# Patient Record
Sex: Male | Born: 1940 | Race: White | Hispanic: No | Marital: Married | Smoking: Never smoker
Health system: Southern US, Community
[De-identification: ages and names within clinical notes are randomized; demographics above are authoritative.]

## PROBLEM LIST (undated history)

## (undated) DIAGNOSIS — T7840XA Allergy, unspecified, initial encounter: Secondary | ICD-10-CM

## (undated) DIAGNOSIS — E119 Type 2 diabetes mellitus without complications: Secondary | ICD-10-CM

## (undated) DIAGNOSIS — E785 Hyperlipidemia, unspecified: Secondary | ICD-10-CM

## (undated) DIAGNOSIS — N529 Male erectile dysfunction, unspecified: Secondary | ICD-10-CM

## (undated) DIAGNOSIS — I2510 Atherosclerotic heart disease of native coronary artery without angina pectoris: Secondary | ICD-10-CM

## (undated) DIAGNOSIS — I319 Disease of pericardium, unspecified: Secondary | ICD-10-CM

## (undated) DIAGNOSIS — M1990 Unspecified osteoarthritis, unspecified site: Secondary | ICD-10-CM

## (undated) DIAGNOSIS — B019 Varicella without complication: Secondary | ICD-10-CM

## (undated) DIAGNOSIS — I209 Angina pectoris, unspecified: Secondary | ICD-10-CM

## (undated) DIAGNOSIS — I519 Heart disease, unspecified: Secondary | ICD-10-CM

## (undated) HISTORY — PX: AMPUTATION FINGER / THUMB: SUR24

## (undated) HISTORY — DX: Varicella without complication: B01.9

## (undated) HISTORY — DX: Hyperlipidemia, unspecified: E78.5

## (undated) HISTORY — PX: EYE SURGERY: SHX253

## (undated) HISTORY — DX: Disease of pericardium, unspecified: I31.9

## (undated) HISTORY — PX: NASAL SEPTUM SURGERY: SHX37

## (undated) HISTORY — DX: Allergy, unspecified, initial encounter: T78.40XA

## (undated) HISTORY — DX: Heart disease, unspecified: I51.9

## (undated) HISTORY — DX: Male erectile dysfunction, unspecified: N52.9

---

## 1976-03-26 DIAGNOSIS — I319 Disease of pericardium, unspecified: Secondary | ICD-10-CM

## 1976-03-26 HISTORY — DX: Disease of pericardium, unspecified: I31.9

## 2010-05-16 ENCOUNTER — Encounter: Payer: Self-pay | Admitting: Cardiology

## 2010-05-19 ENCOUNTER — Encounter: Payer: Self-pay | Admitting: Cardiology

## 2010-05-26 ENCOUNTER — Encounter: Payer: Self-pay | Admitting: Cardiology

## 2010-06-02 DIAGNOSIS — R079 Chest pain, unspecified: Secondary | ICD-10-CM | POA: Insufficient documentation

## 2010-06-02 DIAGNOSIS — E785 Hyperlipidemia, unspecified: Secondary | ICD-10-CM | POA: Insufficient documentation

## 2010-06-05 ENCOUNTER — Other Ambulatory Visit: Payer: Self-pay | Admitting: Cardiology

## 2010-06-05 ENCOUNTER — Encounter: Payer: Self-pay | Admitting: Cardiology

## 2010-06-05 ENCOUNTER — Ambulatory Visit (INDEPENDENT_AMBULATORY_CARE_PROVIDER_SITE_OTHER): Payer: Medicare Other | Admitting: Cardiology

## 2010-06-05 DIAGNOSIS — R0789 Other chest pain: Secondary | ICD-10-CM

## 2010-06-05 DIAGNOSIS — I200 Unstable angina: Secondary | ICD-10-CM

## 2010-06-05 DIAGNOSIS — Z01810 Encounter for preprocedural cardiovascular examination: Secondary | ICD-10-CM

## 2010-06-05 LAB — CBC WITH DIFFERENTIAL/PLATELET
Basophils Relative: 0.5 % (ref 0.0–3.0)
Eosinophils Absolute: 0.2 10*3/uL (ref 0.0–0.7)
Hemoglobin: 16.8 g/dL (ref 13.0–17.0)
Lymphocytes Relative: 13.8 % (ref 12.0–46.0)
MCHC: 34.6 g/dL (ref 30.0–36.0)
MCV: 96.7 fl (ref 78.0–100.0)
Neutro Abs: 9.6 10*3/uL — ABNORMAL HIGH (ref 1.4–7.7)
RBC: 5.03 Mil/uL (ref 4.22–5.81)

## 2010-06-05 LAB — BASIC METABOLIC PANEL
BUN: 18 mg/dL (ref 6–23)
Chloride: 100 mEq/L (ref 96–112)
Creatinine, Ser: 1 mg/dL (ref 0.4–1.5)

## 2010-06-05 LAB — APTT: aPTT: 32.6 s — ABNORMAL HIGH (ref 21.7–28.8)

## 2010-06-06 ENCOUNTER — Telehealth: Payer: Self-pay | Admitting: Cardiology

## 2010-06-09 ENCOUNTER — Ambulatory Visit: Admission: RE | Admit: 2010-06-09 | Payer: Medicare Other | Source: Ambulatory Visit | Admitting: Cardiology

## 2010-06-09 ENCOUNTER — Inpatient Hospital Stay (HOSPITAL_BASED_OUTPATIENT_CLINIC_OR_DEPARTMENT_OTHER)
Admission: RE | Admit: 2010-06-09 | Discharge: 2010-06-09 | Disposition: A | Discharge status: Home / Self Care | Payer: Medicare Other | Source: Ambulatory Visit | Attending: Cardiology | Admitting: Cardiology

## 2010-06-09 DIAGNOSIS — R0989 Other specified symptoms and signs involving the circulatory and respiratory systems: Secondary | ICD-10-CM | POA: Insufficient documentation

## 2010-06-09 DIAGNOSIS — R0609 Other forms of dyspnea: Secondary | ICD-10-CM | POA: Insufficient documentation

## 2010-06-09 DIAGNOSIS — I2510 Atherosclerotic heart disease of native coronary artery without angina pectoris: Secondary | ICD-10-CM | POA: Insufficient documentation

## 2010-06-09 DIAGNOSIS — I200 Unstable angina: Secondary | ICD-10-CM | POA: Insufficient documentation

## 2010-06-13 ENCOUNTER — Telehealth: Payer: Self-pay | Admitting: Cardiology

## 2010-06-13 NOTE — Letter (Signed)
Summary: Cardiac Catheterization Instructions- [facility]  [employer], Main Office  [address]   [place], [place] [address]   Phone: [phone]  Fax: [phone]     06/05/2010 MRN: [id number]  Omar Bridges [address] [place], [place]  [address]  [place]  Dear Mr. KUMPF,   You are scheduled for a Cardiac Catheterization on Friday June 09, 2010 with Dr.Bryten Maher  Please arrive to the 1st floor of the Heart and Vascular Center at [facility] at 6:30 am on the day of your procedure. Please do not arrive before 6:30 a.m. Call the Heart and Vascular Center at [phone] if you are unable to make your appointmnet. The Code to get into the parking garage under the building is 3000. Take the elevators to the 1st floor. You must have someone to drive you home. Someone must be with you for the first 24 hours after you arrive home. Please wear clothes that are easy to get on and off and wear slip-on shoes. Do not eat or drink after midnight except water with your medications that morning. Bring all your medications and current insurance cards with you.    ___ Make sure you take your aspirin.  ___ You may take ALL of your medications with water that morning.      The usual length of stay after your procedure is 2 to 3 hours. This can vary.  If you have any questions, please call the office at the number listed above.   Rocco Serene, RN

## 2010-06-13 NOTE — Assessment & Plan Note (Signed)
Summary: chest pain/dr jacobucci [phone]/medicare [phone]   Visit Type:  Initial Consult Primary Provider:  Dr. Pablo Lawrence  CC:  Angina.  History of Present Illness: The patient presents for evaluation of jaw discomfort and dyspnea. The patient has no past cardiac history other than pericarditis at age 70. He reports a normal catheterization at that time. These have some discomfort in his throat with activities for about 5 years. However, this was only in cold weather. It was mild. He did have a stress test in November of last year and apparently was unremarkable. He has also seen a GI doctor and been told he had cholelithiasis though this was not felt to be the culprit. Over the past 3 weeks he has noticed increasing discomfort is similar to previous throat tightness. However, he now has discomfort going into his jaw. He has been short of breath with this and having increased fatigue. These are all new symptoms. They are reproducible with minimal exertion such as walking up stairs and it is increasing in frequency and severity. He has not yet had resting discomfort. He has actually stopped his physical activity recently. He denies palpitations, presyncope or syncope. He has had no PND or orthopnea.   Current Medications (verified): 1)  Nitrostat 0.4 Mg Subl (Nitroglycerin) .... As Needed As Directed For Chest Pian 2)  Tylenol Severe Allergy 12.5-500 Mg Tabs (Diphenhydramine-Acetaminophen) .... As Needed 3)  Excedrin Pm 500-38 Mg Tabs (Diphenhydramine-Apap (Sleep)) .... 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  Social History: Full Time Married  Never smoked, chewed tobacco Alcohol Use -  no Regular Exercise - no  Review of Systems       Positive for reflux. Otherwise as stated in the history of present illness negative for all other systems.  Vital Signs:  Patient profile:   70 year old male Height:      72 inches Weight:      199 pounds BMI:     27.09 Pulse rate:   80 / minute Resp:     18 per minute BP sitting:   133 / 72  (right arm)  Vitals Entered By: Marrion Coy, CNA (June 05, 2010 12:39 PM)  Physical Exam  General:  Well developed, well nourished, in no acute distress. Head:  normocephalic and atraumatic Eyes:  PERRLA/EOM intact; conjunctiva and lids normal. Mouth:  Teeth, gums and palate normal. Oral mucosa normal. Neck:  Neck supple, no JVD. No masses, thyromegaly or abnormal cervical nodes. Chest Wall:  no deformities or breast masses noted Lungs:  Clear bilaterally to auscultation and percussion. Abdomen:  Bowel sounds positive; abdomen soft and non-tender without masses, organomegaly, or hernias noted. No hepatosplenomegaly. Msk:  Back normal, normal gait. Muscle strength and tone normal. Extremities:  No clubbing or cyanosis. Neurologic:  Alert and oriented x 3. Skin:  Intact without lesions or rashes. Cervical Nodes:  no significant adenopathy Axillary Nodes:  no significant adenopathy Inguinal Nodes:  no significant adenopathy Psych:  Normal affect.   Detailed Cardiovascular Exam  Neck    Carotids: Carotids full and equal bilaterally without bruits.      Neck Veins: Normal, no JVD.    Heart    Inspection: no deformities or lifts noted.  Palpation: normal PMI with no thrills palpable.      Auscultation: regular rate and rhythm, S1, S2 without murmurs, rubs, gallops, or clicks.    Vascular    Abdominal Aorta: no palpable masses, pulsations, or audible bruits.      Femoral Pulses: normal femoral pulses bilaterally.      Pedal Pulses: normal pedal pulses bilaterally.      Radial Pulses: normal radial pulses bilaterally.       Peripheral Circulation: no clubbing, cyanosis, or edema noted with normal capillary refill.     EKG  Procedure date:  05/16/2010  Findings:      Sinus rhythm, rate 80, leftward axis, no acute ST-T wave changes  Impression & Recommendations:  Problem # 1:  CHEST PAIN-UNSPECIFIED (ICD-786.50) The patient's chest pain is consistent with increasing exertional angina (unstable). The pretest daily of obstructive disease is high and the patient will proceed with cardiac cath. His updated medication list for this problem includes:    Nitrostat 0.4 Mg Subl (Nitroglycerin) .Marland Kitchen... As needed as directed for chest pian  Orders: TLB-CBC Platelet - w/Differential (85025-CBCD) TLB-BMP (Basic Metabolic Panel-BMET) (80048-METABOL) TLB-PTT (85730-PTTL) TLB-PT (Protime) (85610-PTP)  Problem # 2:  HYPERLIPIDEMIA-MIXED (ICD-272.4) This has been managed by his primary MD.    We will determine goals based on the presence or absence of coronary disease.  Patient Instructions: 1)  Your physician recommends that you schedule a follow-up appointment after your cardiac cath. 2)  Your physician recommends that you continue on your current medications as directed. Please refer to the Current Medication list given to you today. 3)  Your physician has requested that you have a cardiac catheterization.  Cardiac catheterization is used to diagnose and/or treat various heart conditions. Doctors may recommend this procedure for a number of different reasons. The most common reason is to evaluate chest pain. Chest pain can be a symptom of coronary artery disease (CAD), and cardiac catheterization can show whether plaque is narrowing or blocking your heart's arteries. This procedure is also used to evaluate the valves, as well as measure the blood flow and oxygen levels in different parts of your heart.  For further information please visit [email or web address]  Please follow instruction sheet, as given.

## 2010-06-13 NOTE — Progress Notes (Signed)
Summary: RX Nexium 40 mg #30    Rx for Nexium 40 mg daily #30 no refills sent electronically Avie Arenas, RN  ---- Converted from flag ---- ---- 06/05/2010 2:54 PM, Rollene Rotunda, MD, [facility] wrote: Please send a prescription for Nexium 40 mg daily by mouth x 30 pills.  No refills. ------------------------------

## 2010-06-13 NOTE — Telephone Encounter (Signed)
Left message to call back  

## 2010-06-13 NOTE — Miscellaneous (Signed)
Summary: order for Nexium 40  Clinical Lists Changes  Medications: Added new medication of NEXIUM 40 MG CPDR (ESOMEPRAZOLE MAGNESIUM) one daily - Signed Rx of NEXIUM 40 MG CPDR (ESOMEPRAZOLE MAGNESIUM) one daily;  #30 x 0;  Signed;  Entered by: Charolotte Capuchin, RN;  Authorized by: Rollene Rotunda, MD, [facility];  Method used: Electronically to [facility] [phone]*, [address], [place], [place]  [address], Ph: [phone], Fax: [phone]    Prescriptions: NEXIUM 40 MG CPDR (ESOMEPRAZOLE MAGNESIUM) one daily  #30 x 0   Entered by:   Charolotte Capuchin, RN   Authorized by:   Rollene Rotunda, MD, [facility]   Signed by:   Charolotte Capuchin, RN on 06/06/2010   Method used:   Electronically to        [facility] [phone]* (retail)       [address]       [place], [place]  [address]       Ph: [phone]       Fax: [phone]   RxID:   [id number]

## 2010-06-14 NOTE — Telephone Encounter (Signed)
Per Dr Antoine Poche - OK for pt to hold Toprol -  Pt is aware and states understanding.  He does have an appt tomorrow with Dr Tyrone Sage.

## 2010-06-15 ENCOUNTER — Encounter (INDEPENDENT_AMBULATORY_CARE_PROVIDER_SITE_OTHER): Payer: Medicare Other | Admitting: Cardiothoracic Surgery

## 2010-06-15 DIAGNOSIS — I2510 Atherosclerotic heart disease of native coronary artery without angina pectoris: Secondary | ICD-10-CM

## 2010-06-16 ENCOUNTER — Other Ambulatory Visit: Payer: Self-pay | Admitting: Cardiothoracic Surgery

## 2010-06-16 ENCOUNTER — Ambulatory Visit (HOSPITAL_COMMUNITY)
Admission: RE | Admit: 2010-06-16 | Discharge: 2010-06-16 | Disposition: A | Discharge status: Home / Self Care | Payer: Medicare Other | Source: Ambulatory Visit | Attending: Cardiothoracic Surgery | Admitting: Cardiothoracic Surgery

## 2010-06-16 ENCOUNTER — Telehealth: Payer: Self-pay | Admitting: Cardiology

## 2010-06-16 ENCOUNTER — Inpatient Hospital Stay (HOSPITAL_COMMUNITY)
Admission: RE | Admit: 2010-06-16 | Discharge: 2010-06-16 | Disposition: A | Discharge status: Home / Self Care | Payer: Medicare Other | Source: Ambulatory Visit | Attending: Cardiothoracic Surgery | Admitting: Cardiothoracic Surgery

## 2010-06-16 ENCOUNTER — Other Ambulatory Visit (HOSPITAL_COMMUNITY): Payer: Medicare Other

## 2010-06-16 DIAGNOSIS — Z01810 Encounter for preprocedural cardiovascular examination: Secondary | ICD-10-CM | POA: Insufficient documentation

## 2010-06-16 DIAGNOSIS — Z01811 Encounter for preprocedural respiratory examination: Secondary | ICD-10-CM

## 2010-06-16 DIAGNOSIS — Z01818 Encounter for other preprocedural examination: Secondary | ICD-10-CM | POA: Insufficient documentation

## 2010-06-16 DIAGNOSIS — R059 Cough, unspecified: Secondary | ICD-10-CM | POA: Insufficient documentation

## 2010-06-16 DIAGNOSIS — J309 Allergic rhinitis, unspecified: Secondary | ICD-10-CM | POA: Insufficient documentation

## 2010-06-16 DIAGNOSIS — R05 Cough: Secondary | ICD-10-CM | POA: Insufficient documentation

## 2010-06-16 DIAGNOSIS — Z01812 Encounter for preprocedural laboratory examination: Secondary | ICD-10-CM | POA: Insufficient documentation

## 2010-06-16 DIAGNOSIS — I2510 Atherosclerotic heart disease of native coronary artery without angina pectoris: Secondary | ICD-10-CM | POA: Insufficient documentation

## 2010-06-16 LAB — URINALYSIS, ROUTINE W REFLEX MICROSCOPIC
Bilirubin Urine: NEGATIVE
Glucose, UA: NEGATIVE mg/dL
Hgb urine dipstick: NEGATIVE
Ketones, ur: NEGATIVE mg/dL
Nitrite: NEGATIVE
Protein, ur: NEGATIVE mg/dL
Specific Gravity, Urine: 1.018 (ref 1.005–1.030)
Urobilinogen, UA: 1 mg/dL (ref 0.0–1.0)
pH: 7.5 (ref 5.0–8.0)

## 2010-06-16 LAB — COMPREHENSIVE METABOLIC PANEL
ALT: 32 U/L (ref 0–53)
AST: 35 U/L (ref 0–37)
Albumin: 4.5 g/dL (ref 3.5–5.2)
Alkaline Phosphatase: 46 U/L (ref 39–117)
BUN: 17 mg/dL (ref 6–23)
CO2: 22 mEq/L (ref 19–32)
Calcium: 9.4 mg/dL (ref 8.4–10.5)
Chloride: 106 mEq/L (ref 96–112)
Creatinine, Ser: 0.88 mg/dL (ref 0.4–1.5)
GFR calc Af Amer: >60 mL/min (ref >60)
GFR calc non Af Amer: >60 mL/min (ref >60)
Glucose, Bld: 177 mg/dL — ABNORMAL HIGH (ref 70–99)
Potassium: 4 mEq/L (ref 3.5–5.1)
Sodium: 134 mEq/L — ABNORMAL LOW (ref 135–145)
Total Bilirubin: 0.7 mg/dL (ref 0.3–1.2)
Total Protein: 7 g/dL (ref 6.0–8.3)

## 2010-06-16 LAB — BLOOD GAS, ARTERIAL
Acid-base deficit: 1.1 mmol/L (ref 0.0–2.0)
Bicarbonate: 23 mEq/L (ref 20.0–24.0)
Drawn by: 206361
FIO2: 0.21 %
O2 Saturation: 96.4 %
Patient temperature: 98.6
TCO2: 24.2 mmol/L (ref 0–100)
pCO2 arterial: 37.8 mmHg (ref 35.0–45.0)
pH, Arterial: 7.402 (ref 7.350–7.450)
pO2, Arterial: 84.7 mmHg (ref 80.0–100.0)

## 2010-06-16 LAB — HEMOGLOBIN A1C
Hgb A1c MFr Bld: 6 % — ABNORMAL HIGH (ref <5.7)
Mean Plasma Glucose: 126 mg/dL — ABNORMAL HIGH (ref <117)

## 2010-06-16 LAB — TYPE AND SCREEN
ABO/RH(D): O POS
Antibody Screen: NEGATIVE

## 2010-06-16 LAB — CBC
HCT: 45.9 % (ref 39.0–52.0)
Hemoglobin: 16.5 g/dL (ref 13.0–17.0)
MCH: 32.9 pg (ref 26.0–34.0)
MCHC: 35.9 g/dL (ref 30.0–36.0)
MCV: 91.6 fL (ref 78.0–100.0)
Platelets: 198 10*3/uL (ref 150–400)
RBC: 5.01 MIL/uL (ref 4.22–5.81)
RDW: 12.5 % (ref 11.5–15.5)
WBC: 10.4 10*3/uL (ref 4.0–10.5)

## 2010-06-16 LAB — ABO/RH: ABO/RH(D): O POS

## 2010-06-16 LAB — SURGICAL PCR SCREEN
MRSA, PCR: NEGATIVE
Staphylococcus aureus: NEGATIVE

## 2010-06-16 LAB — PROTIME-INR
INR: 0.96 (ref 0.00–1.49)
Prothrombin Time: 13 seconds (ref 11.6–15.2)

## 2010-06-16 LAB — APTT: aPTT: 34 seconds (ref 24–37)

## 2010-06-16 NOTE — Telephone Encounter (Signed)
Faxed OV, Cath & Labs to Tanya (per Delice Bison) at [facility] ([phone]).

## 2010-06-20 ENCOUNTER — Inpatient Hospital Stay (HOSPITAL_COMMUNITY)
Admission: RE | Admit: 2010-06-20 | Discharge: 2010-06-24 | DRG: 236 | Disposition: A | Discharge status: Home / Self Care | Payer: Medicare Other | Source: Ambulatory Visit | Attending: Cardiothoracic Surgery | Admitting: Cardiothoracic Surgery

## 2010-06-20 ENCOUNTER — Inpatient Hospital Stay (HOSPITAL_COMMUNITY): Payer: Medicare Other

## 2010-06-20 DIAGNOSIS — I2510 Atherosclerotic heart disease of native coronary artery without angina pectoris: Principal | ICD-10-CM | POA: Diagnosis present

## 2010-06-20 DIAGNOSIS — I200 Unstable angina: Secondary | ICD-10-CM | POA: Diagnosis present

## 2010-06-20 DIAGNOSIS — D696 Thrombocytopenia, unspecified: Secondary | ICD-10-CM | POA: Diagnosis not present

## 2010-06-20 DIAGNOSIS — D72829 Elevated white blood cell count, unspecified: Secondary | ICD-10-CM | POA: Diagnosis not present

## 2010-06-20 DIAGNOSIS — D62 Acute posthemorrhagic anemia: Secondary | ICD-10-CM | POA: Diagnosis not present

## 2010-06-20 LAB — POCT I-STAT 4, (NA,K, GLUC, HGB,HCT)
Glucose, Bld: 122 mg/dL — ABNORMAL HIGH (ref 70–99)
Glucose, Bld: 124 mg/dL — ABNORMAL HIGH (ref 70–99)
Glucose, Bld: 122 mg/dL — ABNORMAL HIGH (ref 70–99)
Glucose, Bld: 125 mg/dL — ABNORMAL HIGH (ref 70–99)
Glucose, Bld: 109 mg/dL — ABNORMAL HIGH (ref 70–99)
HCT: 37 % — ABNORMAL LOW (ref 39.0–52.0)
HCT: 40 % (ref 39.0–52.0)
HCT: 32 % — ABNORMAL LOW (ref 39.0–52.0)
HCT: 32 % — ABNORMAL LOW (ref 39.0–52.0)
HCT: 27 % — ABNORMAL LOW (ref 39.0–52.0)
Hemoglobin: 12.6 g/dL — ABNORMAL LOW (ref 13.0–17.0)
Hemoglobin: 13.6 g/dL (ref 13.0–17.0)
Hemoglobin: 10.9 g/dL — ABNORMAL LOW (ref 13.0–17.0)
Hemoglobin: 10.9 g/dL — ABNORMAL LOW (ref 13.0–17.0)
Hemoglobin: 9.2 g/dL — ABNORMAL LOW (ref 13.0–17.0)
Potassium: 3.9 mEq/L (ref 3.5–5.1)
Potassium: 4.2 mEq/L (ref 3.5–5.1)
Potassium: 4.1 mEq/L (ref 3.5–5.1)
Potassium: 4.6 mEq/L (ref 3.5–5.1)
Potassium: 3.8 mEq/L (ref 3.5–5.1)
Sodium: 140 mEq/L (ref 135–145)
Sodium: 138 mEq/L (ref 135–145)
Sodium: 133 mEq/L — ABNORMAL LOW (ref 135–145)
Sodium: 136 mEq/L (ref 135–145)
Sodium: 139 mEq/L (ref 135–145)

## 2010-06-20 LAB — POCT I-STAT 3, ART BLOOD GAS (G3+)
Acid-base deficit: 1 mmol/L (ref 0.0–2.0)
Acid-base deficit: 3 mmol/L — ABNORMAL HIGH (ref 0.0–2.0)
Bicarbonate: 23.2 mEq/L (ref 20.0–24.0)
Bicarbonate: 22.8 mEq/L (ref 20.0–24.0)
O2 Saturation: 100 %
O2 Saturation: 99 %
Patient temperature: 31.5
Patient temperature: 37
TCO2: 24 mmol/L (ref 0–100)
TCO2: 24 mmol/L (ref 0–100)
pCO2 arterial: 29.3 mmHg — ABNORMAL LOW (ref 35.0–45.0)
pCO2 arterial: 43.9 mmHg (ref 35.0–45.0)
pH, Arterial: 7.483 — ABNORMAL HIGH (ref 7.350–7.450)
pH, Arterial: 7.323 — ABNORMAL LOW (ref 7.350–7.450)
pO2, Arterial: 274 mmHg — ABNORMAL HIGH (ref 80.0–100.0)
pO2, Arterial: 142 mmHg — ABNORMAL HIGH (ref 80.0–100.0)

## 2010-06-20 LAB — POCT I-STAT 3, VENOUS BLOOD GAS (G3P V)
Acid-base deficit: 2 mmol/L (ref 0.0–2.0)
Bicarbonate: 22.9 mEq/L (ref 20.0–24.0)
O2 Saturation: 82 %
Patient temperature: 31.5
TCO2: 24 mmol/L (ref 0–100)
pCO2, Ven: 31.5 mmHg — ABNORMAL LOW (ref 45.0–50.0)
pH, Ven: 7.444 — ABNORMAL HIGH (ref 7.250–7.300)
pO2, Ven: 32 mmHg (ref 30.0–45.0)

## 2010-06-20 LAB — PROTIME-INR
INR: 1.36 (ref 0.00–1.49)
Prothrombin Time: 17 seconds — ABNORMAL HIGH (ref 11.6–15.2)

## 2010-06-20 LAB — CBC
HCT: 36.5 % — ABNORMAL LOW (ref 39.0–52.0)
HCT: 35.9 % — ABNORMAL LOW (ref 39.0–52.0)
Hemoglobin: 13 g/dL (ref 13.0–17.0)
Hemoglobin: 12.7 g/dL — ABNORMAL LOW (ref 13.0–17.0)
MCH: 32.3 pg (ref 26.0–34.0)
MCH: 32.2 pg (ref 26.0–34.0)
MCHC: 35.6 g/dL (ref 30.0–36.0)
MCHC: 35.4 g/dL (ref 30.0–36.0)
MCV: 90.8 fL (ref 78.0–100.0)
MCV: 91.1 fL (ref 78.0–100.0)
Platelets: 126 10*3/uL — ABNORMAL LOW (ref 150–400)
Platelets: 146 10*3/uL — ABNORMAL LOW (ref 150–400)
RBC: 4.02 MIL/uL — ABNORMAL LOW (ref 4.22–5.81)
RBC: 3.94 MIL/uL — ABNORMAL LOW (ref 4.22–5.81)
RDW: 12.4 % (ref 11.5–15.5)
RDW: 12.4 % (ref 11.5–15.5)
WBC: 17.3 10*3/uL — ABNORMAL HIGH (ref 4.0–10.5)
WBC: 16.1 10*3/uL — ABNORMAL HIGH (ref 4.0–10.5)

## 2010-06-20 LAB — HEMOGLOBIN AND HEMATOCRIT, BLOOD
HCT: 31.8 % — ABNORMAL LOW (ref 39.0–52.0)
Hemoglobin: 11.3 g/dL — ABNORMAL LOW (ref 13.0–17.0)

## 2010-06-20 LAB — CREATININE, SERUM
Creatinine, Ser: 0.85 mg/dL (ref 0.4–1.5)
GFR calc Af Amer: >60 mL/min (ref >60)
GFR calc non Af Amer: >60 mL/min (ref >60)

## 2010-06-20 LAB — GLUCOSE, CAPILLARY
Glucose-Capillary: 87 mg/dL (ref 70–99)
Glucose-Capillary: 86 mg/dL (ref 70–99)
Glucose-Capillary: 113 mg/dL — ABNORMAL HIGH (ref 70–99)

## 2010-06-20 LAB — PLATELET COUNT: Platelets: 143 10*3/uL — ABNORMAL LOW (ref 150–400)

## 2010-06-20 LAB — POCT I-STAT GLUCOSE
Glucose, Bld: 105 mg/dL — ABNORMAL HIGH (ref 70–99)
Operator id: 286331

## 2010-06-20 LAB — MAGNESIUM: Magnesium: 3.2 mg/dL — ABNORMAL HIGH (ref 1.5–2.5)

## 2010-06-20 LAB — APTT: aPTT: 31 seconds (ref 24–37)

## 2010-06-21 ENCOUNTER — Inpatient Hospital Stay (HOSPITAL_COMMUNITY): Payer: Medicare Other

## 2010-06-21 LAB — POCT I-STAT 3, ART BLOOD GAS (G3+)
Acid-base deficit: 3 mmol/L — ABNORMAL HIGH (ref 0.0–2.0)
Acid-base deficit: 3 mmol/L — ABNORMAL HIGH (ref 0.0–2.0)
Bicarbonate: 23.2 mEq/L (ref 20.0–24.0)
Bicarbonate: 22.8 mEq/L (ref 20.0–24.0)
O2 Saturation: 96 %
O2 Saturation: 97 %
Patient temperature: 35.7
Patient temperature: 37.2
TCO2: 24 mmol/L (ref 0–100)
TCO2: 24 mmol/L (ref 0–100)
pCO2 arterial: 40.2 mmHg (ref 35.0–45.0)
pCO2 arterial: 43.9 mmHg (ref 35.0–45.0)
pH, Arterial: 7.363 (ref 7.350–7.450)
pH, Arterial: 7.324 — ABNORMAL LOW (ref 7.350–7.450)
pO2, Arterial: 83 mmHg (ref 80.0–100.0)
pO2, Arterial: 95 mmHg (ref 80.0–100.0)

## 2010-06-21 LAB — POCT I-STAT, CHEM 8
BUN: 16 mg/dL (ref 6–23)
BUN: 30 mg/dL — ABNORMAL HIGH (ref 6–23)
Calcium, Ion: 1.07 mmol/L — ABNORMAL LOW (ref 1.12–1.32)
Calcium, Ion: 1.09 mmol/L — ABNORMAL LOW (ref 1.12–1.32)
Chloride: 105 mEq/L (ref 96–112)
Chloride: 104 mEq/L (ref 96–112)
Creatinine, Ser: 0.8 mg/dL (ref 0.4–1.5)
Creatinine, Ser: 0.9 mg/dL (ref 0.4–1.5)
Glucose, Bld: 173 mg/dL — ABNORMAL HIGH (ref 70–99)
Glucose, Bld: 146 mg/dL — ABNORMAL HIGH (ref 70–99)
HCT: 36 % — ABNORMAL LOW (ref 39.0–52.0)
HCT: 37 % — ABNORMAL LOW (ref 39.0–52.0)
Hemoglobin: 12.2 g/dL — ABNORMAL LOW (ref 13.0–17.0)
Hemoglobin: 12.6 g/dL — ABNORMAL LOW (ref 13.0–17.0)
Potassium: 4.8 mEq/L (ref 3.5–5.1)
Potassium: 4.2 mEq/L (ref 3.5–5.1)
Sodium: 139 mEq/L (ref 135–145)
Sodium: 137 mEq/L (ref 135–145)
TCO2: 24 mmol/L (ref 0–100)
TCO2: 23 mmol/L (ref 0–100)

## 2010-06-21 LAB — CBC
HCT: 35.2 % — ABNORMAL LOW (ref 39.0–52.0)
HCT: 37.1 % — ABNORMAL LOW (ref 39.0–52.0)
Hemoglobin: 12.3 g/dL — ABNORMAL LOW (ref 13.0–17.0)
Hemoglobin: 12.6 g/dL — ABNORMAL LOW (ref 13.0–17.0)
MCH: 32.1 pg (ref 26.0–34.0)
MCH: 31.7 pg (ref 26.0–34.0)
MCHC: 34.9 g/dL (ref 30.0–36.0)
MCHC: 34 g/dL (ref 30.0–36.0)
MCV: 91.9 fL (ref 78.0–100.0)
MCV: 93.2 fL (ref 78.0–100.0)
Platelets: 124 10*3/uL — ABNORMAL LOW (ref 150–400)
Platelets: 141 10*3/uL — ABNORMAL LOW (ref 150–400)
RBC: 3.83 MIL/uL — ABNORMAL LOW (ref 4.22–5.81)
RBC: 3.98 MIL/uL — ABNORMAL LOW (ref 4.22–5.81)
RDW: 12.8 % (ref 11.5–15.5)
RDW: 13.1 % (ref 11.5–15.5)
WBC: 16.6 10*3/uL — ABNORMAL HIGH (ref 4.0–10.5)
WBC: 20.3 10*3/uL — ABNORMAL HIGH (ref 4.0–10.5)

## 2010-06-21 LAB — GLUCOSE, CAPILLARY
Glucose-Capillary: 151 mg/dL — ABNORMAL HIGH (ref 70–99)
Glucose-Capillary: 148 mg/dL — ABNORMAL HIGH (ref 70–99)
Glucose-Capillary: 152 mg/dL — ABNORMAL HIGH (ref 70–99)
Glucose-Capillary: 126 mg/dL — ABNORMAL HIGH (ref 70–99)

## 2010-06-21 LAB — BASIC METABOLIC PANEL
BUN: 19 mg/dL (ref 6–23)
BUN: 28 mg/dL — ABNORMAL HIGH (ref 6–23)
CO2: 25 mEq/L (ref 19–32)
CO2: 25 mEq/L (ref 19–32)
Calcium: 7.7 mg/dL — ABNORMAL LOW (ref 8.4–10.5)
Calcium: 8.4 mg/dL (ref 8.4–10.5)
Chloride: 109 mEq/L (ref 96–112)
Chloride: 104 mEq/L (ref 96–112)
Creatinine, Ser: 0.94 mg/dL (ref 0.4–1.5)
Creatinine, Ser: 0.95 mg/dL (ref 0.4–1.5)
GFR calc Af Amer: >60 mL/min (ref >60)
GFR calc Af Amer: >60 mL/min (ref >60)
GFR calc non Af Amer: >60 mL/min (ref >60)
GFR calc non Af Amer: >60 mL/min (ref >60)
Glucose, Bld: 183 mg/dL — ABNORMAL HIGH (ref 70–99)
Glucose, Bld: 140 mg/dL — ABNORMAL HIGH (ref 70–99)
Potassium: 4.2 mEq/L (ref 3.5–5.1)
Potassium: 4.2 mEq/L (ref 3.5–5.1)
Sodium: 137 mEq/L (ref 135–145)
Sodium: 137 mEq/L (ref 135–145)

## 2010-06-21 LAB — POCT I-STAT 4, (NA,K, GLUC, HGB,HCT)
Glucose, Bld: 104 mg/dL — ABNORMAL HIGH (ref 70–99)
HCT: 37 % — ABNORMAL LOW (ref 39.0–52.0)
Hemoglobin: 12.6 g/dL — ABNORMAL LOW (ref 13.0–17.0)
Potassium: 3.7 mEq/L (ref 3.5–5.1)
Sodium: 138 mEq/L (ref 135–145)

## 2010-06-21 LAB — MAGNESIUM
Magnesium: 2.9 mg/dL — ABNORMAL HIGH (ref 1.5–2.5)
Magnesium: 2.9 mg/dL — ABNORMAL HIGH (ref 1.5–2.5)

## 2010-06-22 ENCOUNTER — Inpatient Hospital Stay (HOSPITAL_COMMUNITY): Payer: Medicare Other

## 2010-06-22 LAB — CBC
HCT: 34 % — ABNORMAL LOW (ref 39.0–52.0)
Hemoglobin: 11.8 g/dL — ABNORMAL LOW (ref 13.0–17.0)
MCH: 32.5 pg (ref 26.0–34.0)
MCHC: 34.7 g/dL (ref 30.0–36.0)
MCV: 93.7 fL (ref 78.0–100.0)
Platelets: 122 10*3/uL — ABNORMAL LOW (ref 150–400)
RBC: 3.63 MIL/uL — ABNORMAL LOW (ref 4.22–5.81)
RDW: 13.2 % (ref 11.5–15.5)
WBC: 15.7 10*3/uL — ABNORMAL HIGH (ref 4.0–10.5)

## 2010-06-22 LAB — BASIC METABOLIC PANEL
BUN: 25 mg/dL — ABNORMAL HIGH (ref 6–23)
CO2: 28 mEq/L (ref 19–32)
Calcium: 8.2 mg/dL — ABNORMAL LOW (ref 8.4–10.5)
Chloride: 102 mEq/L (ref 96–112)
Creatinine, Ser: 0.81 mg/dL (ref 0.4–1.5)
GFR calc Af Amer: >60 mL/min (ref >60)
GFR calc non Af Amer: >60 mL/min (ref >60)
Glucose, Bld: 123 mg/dL — ABNORMAL HIGH (ref 70–99)
Potassium: 3.9 mEq/L (ref 3.5–5.1)
Sodium: 137 mEq/L (ref 135–145)

## 2010-06-22 LAB — GLUCOSE, CAPILLARY
Glucose-Capillary: 122 mg/dL — ABNORMAL HIGH (ref 70–99)
Glucose-Capillary: 126 mg/dL — ABNORMAL HIGH (ref 70–99)
Glucose-Capillary: 130 mg/dL — ABNORMAL HIGH (ref 70–99)
Glucose-Capillary: 137 mg/dL — ABNORMAL HIGH (ref 70–99)
Glucose-Capillary: 141 mg/dL — ABNORMAL HIGH (ref 70–99)
Glucose-Capillary: 98 mg/dL (ref 70–99)

## 2010-06-22 NOTE — Op Note (Signed)
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  SURGICAL PROCEDURE:  Coronary artery bypass grafting x6 with left internal mammary to left anterior descending coronary artery, reverse saphenous vein graft to the diagonal coronary artery, sequential reverse saphenous vein graft to the first and second obtuse marginal, sequential reverse saphenous vein graft to the mid and distal posterior descending coronary artery with right leg endovein harvesting.  SURGEON:  Sheliah Plane, MD  FIRST ASSISTANT:  Kerin Perna, MD  SECOND ASSISTANT:  Rowe Clack, PA-C  BRIEF HISTORY:  The patient is a 70 year old male who has had at least 6 months of increasing symptoms of chest discomfort with exertion.  He ultimately underwent cardiac catheterization by Dr. Antoine Poche last week. This demonstrated severe three-vessel coronary artery disease with very significant distal disease but on review of the films, it was felt with the patient's symptoms and in spite of the degree of distal disease that he had sufficient proximal disease to warrant coronary artery bypass grafting.  The risks and options were discussed with the patient in detail and he was willing to proceed.  DESCRIPTION OF PROCEDURE:  With Swan-Ganz and arterial line monitors in place, the patient underwent general endotracheal anesthesia without incident.  Skin of chest and legs was prepped with Betadine and draped in usual sterile manner.  Using the [employer]ant  endovein harvesting system, vein was harvested from the right thigh and calf and was of good quality and caliber.  Median sternotomy was performed.  Left internal mammary artery was dissected down as a pedicle graft.  The distal artery was divided, had good free flow.  Pericardium was opened.  Overall, ventricular function appeared preserved.  He was systemically heparinized.  Ascending aorta was cannulated.  The right atrium was cannulated and aortic root vent cardioplegia needle was introduced into the ascending aorta.  The patient was placed on cardiopulmonary bypass 2.4 liters per minute per meter squared.  Sites of anastomosis were selected and dissected out of the epicardium.  The patient's body temperature was cooled to 30 degrees.  Aortic cross-clamp was applied and 500 mL of cold blood potassium cardioplegia was administered with diastolic arrest of the heart.  Myocardial septal temperature was monitored throughout the cross-clamp period.  Attention was turned first to the right coronary artery.  In the midportion of the vessel, there was obvious proximal, distal, and mid disease.  The midportion of the posterior descending was opened and in addition, the more distal portion was opened and still appeared to have a significant lumen.  A 1-mm probe would not pass between these 2 areas.  For this reason, we did a longitudinal side-to-side anastomosis with a running  7-0 Prolene with a second reverse saphenous vein graft.  The distal extent of the same vein was then carried short distance to the distal posterior descending as a jump graft.  The distal anastomosis was performed with a running 7-0 Prolene.  Additional cold blood cardioplegia was administered down the vein graft.  Attention was then turned to the first and second obtuse marginal.  Both of these were partially intramyocardial, dissected out, Each was approximately 1.4 to 1.5 mm in size.  The first obtuse marginal was  opened.  Using diamond type side-to-side anastomosis with a running 7-0 Prolene, distal anastomosis was performed.  The distal extent of the same vein was then carried to the second obtuse marginal and also sewed with a running 7-0 Prolene.  Attention was then turned to the diagonal artery which was relatively long artery but with distal disease.  The vessel was opened and admitted 1-mm probe.  Using a running 7-0 Prolene, distal anastomosis was performed.  Attention was then turned to the left anterior descending coronary artery which trifurcated at the distal portion in each of the branches and just before this trifurcated area, the LAD was opened, admitted a 1-mm probe distally and 1.5-mm probe proximally.  Using a running 8-0 Prolene, left internal mammary artery was anastomosed to left anterior descending coronary artery.  With cross- clamp still in place, 3 punch aortotomies were performed.  Each of the 3 vein grafts were anastomosed to the ascending aorta.  Air was evacuated from the grafts and the ascending aorta.  The bulldog was removed from the mammary artery with rise in myocardial septal temperature.  Aortic cross-clamp was removed.  Total cross-clamp time was 122 minutes.  The patient spontaneously converted to a sinus rhythm.  Atrial and ventricular pacing wires were applied.  Sites of anastomosis were inspected, free of bleeding.  The patient was then ventilated and weaned from cardiopulmonary bypass without difficulty, remained hemodynamically stable.  Total pump time was 157 minutes.  He was decannulated in the usual fashion.  Protamine sulfate was administered with operative field hemostatic.  A left pleural tube and a Blake mediastinal drain were left in place.  Pericardium was loosely reapproximated.  Sternum was closed with #6 stainless steel wire.  Fascia was closed with interrupted 0 Vicryl and running 3-0 Vicryl in subcutaneous tissue, and 4-0 subcuticular stitch  in skin edges.  Dry dressings were applied.  Sponge and needle count was reported as correct at completion of the procedure. The patient tolerated the procedure without obvious complication and was transferred to the Surgical Intensive Care Unit for further postoperative care.  He did not require any blood bank blood products during the operative procedure.     Sheliah Plane, MD     EG/MEDQ  D:  06/21/2010  T:  06/21/2010  Job:  [id number]  cc:   Rollene Rotunda, MD, [facility]  Electronically Signed by Sheliah Plane MD on 06/22/2010 03:39:50 PM

## 2010-06-23 ENCOUNTER — Inpatient Hospital Stay (HOSPITAL_COMMUNITY): Payer: Medicare Other

## 2010-06-23 LAB — CBC
HCT: 33.9 % — ABNORMAL LOW (ref 39.0–52.0)
Hemoglobin: 11.4 g/dL — ABNORMAL LOW (ref 13.0–17.0)
MCH: 31.6 pg (ref 26.0–34.0)
MCHC: 33.6 g/dL (ref 30.0–36.0)
MCV: 93.9 fL (ref 78.0–100.0)
Platelets: 141 10*3/uL — ABNORMAL LOW (ref 150–400)
RBC: 3.61 MIL/uL — ABNORMAL LOW (ref 4.22–5.81)
RDW: 13 % (ref 11.5–15.5)
WBC: 12 10*3/uL — ABNORMAL HIGH (ref 4.0–10.5)

## 2010-06-23 LAB — BASIC METABOLIC PANEL
BUN: 22 mg/dL (ref 6–23)
CO2: 29 mEq/L (ref 19–32)
Calcium: 8.2 mg/dL — ABNORMAL LOW (ref 8.4–10.5)
Chloride: 104 mEq/L (ref 96–112)
Creatinine, Ser: 0.82 mg/dL (ref 0.4–1.5)
GFR calc Af Amer: >60 mL/min (ref >60)
GFR calc non Af Amer: >60 mL/min (ref >60)
Glucose, Bld: 109 mg/dL — ABNORMAL HIGH (ref 70–99)
Potassium: 4.3 mEq/L (ref 3.5–5.1)
Sodium: 137 mEq/L (ref 135–145)

## 2010-06-23 LAB — GLUCOSE, CAPILLARY: Glucose-Capillary: 126 mg/dL — ABNORMAL HIGH (ref 70–99)

## 2010-06-25 HISTORY — PX: CORONARY ARTERY BYPASS GRAFT: SHX141

## 2010-06-27 ENCOUNTER — Emergency Department (HOSPITAL_COMMUNITY)
Admission: EM | Admit: 2010-06-27 | Discharge: 2010-06-28 | Disposition: A | Discharge status: Home / Self Care | Payer: Medicare Other | Attending: Emergency Medicine | Admitting: Emergency Medicine

## 2010-06-27 ENCOUNTER — Emergency Department (HOSPITAL_COMMUNITY): Payer: Medicare Other

## 2010-06-27 DIAGNOSIS — I319 Disease of pericardium, unspecified: Secondary | ICD-10-CM | POA: Insufficient documentation

## 2010-06-27 DIAGNOSIS — K219 Gastro-esophageal reflux disease without esophagitis: Secondary | ICD-10-CM | POA: Insufficient documentation

## 2010-06-27 DIAGNOSIS — J90 Pleural effusion, not elsewhere classified: Secondary | ICD-10-CM | POA: Insufficient documentation

## 2010-06-27 DIAGNOSIS — R0789 Other chest pain: Secondary | ICD-10-CM | POA: Insufficient documentation

## 2010-06-27 DIAGNOSIS — K7689 Other specified diseases of liver: Secondary | ICD-10-CM | POA: Insufficient documentation

## 2010-06-27 DIAGNOSIS — I498 Other specified cardiac arrhythmias: Secondary | ICD-10-CM | POA: Insufficient documentation

## 2010-06-27 DIAGNOSIS — E7800 Pure hypercholesterolemia, unspecified: Secondary | ICD-10-CM | POA: Insufficient documentation

## 2010-06-27 DIAGNOSIS — I2510 Atherosclerotic heart disease of native coronary artery without angina pectoris: Secondary | ICD-10-CM | POA: Insufficient documentation

## 2010-06-27 DIAGNOSIS — Z951 Presence of aortocoronary bypass graft: Secondary | ICD-10-CM | POA: Insufficient documentation

## 2010-06-27 LAB — BASIC METABOLIC PANEL
BUN: 20 mg/dL (ref 6–23)
Calcium: 9.5 mg/dL (ref 8.4–10.5)
Creatinine, Ser: 0.96 mg/dL (ref 0.4–1.5)
GFR calc non Af Amer: >60 mL/min (ref >60)
Glucose, Bld: 151 mg/dL — ABNORMAL HIGH (ref 70–99)
Potassium: 3.9 mEq/L (ref 3.5–5.1)

## 2010-06-27 LAB — CBC
MCV: 92.4 fL (ref 78.0–100.0)
Platelets: 334 10*3/uL (ref 150–400)
RBC: 4.1 MIL/uL — ABNORMAL LOW (ref 4.22–5.81)
RDW: 12.8 % (ref 11.5–15.5)
WBC: 14.1 10*3/uL — ABNORMAL HIGH (ref 4.0–10.5)

## 2010-06-27 LAB — DIFFERENTIAL
Basophils Absolute: 0.1 10*3/uL (ref 0.0–0.1)
Basophils Relative: 1 % (ref 0–1)
Eosinophils Absolute: 0.5 10*3/uL (ref 0.0–0.7)
Eosinophils Relative: 4 % (ref 0–5)
Lymphs Abs: 2.1 10*3/uL (ref 0.7–4.0)
Neutrophils Relative %: 75 % (ref 43–77)

## 2010-06-27 LAB — POCT CARDIAC MARKERS
CKMB, poc: 1.5 ng/mL (ref 1.0–8.0)
Myoglobin, poc: 110 ng/mL (ref 12–200)
Troponin i, poc: <0.05 ng/mL (ref 0.00–0.09)

## 2010-06-28 ENCOUNTER — Emergency Department (HOSPITAL_COMMUNITY): Payer: Medicare Other

## 2010-06-28 ENCOUNTER — Telehealth: Payer: Self-pay | Admitting: Cardiology

## 2010-06-28 MED ORDER — IOHEXOL 300 MG/ML  SOLN
100.0000 mL | Freq: Once | INTRAMUSCULAR | Status: AC | PRN
  Administered 2010-06-28: 100 mL via INTRAVENOUS

## 2010-06-28 NOTE — Telephone Encounter (Signed)
Pt was seen in the ed last night and was told to be seen within the next few days. Dr Antoine Poche or Lorin Picket doesn't have anything available.

## 2010-06-28 NOTE — Telephone Encounter (Signed)
Pt calling wanting earlier eph appoint--is having palps and thinks heart rate too high( in the 90's per daughter)--advised we have an opening at 3:45 07/03/10 with dr mclean--pt's daughter accepted this appoint.--nt

## 2010-06-28 NOTE — Discharge Summary (Addendum)
NAME:  Omar Bridges, Omar Bridges NO.:  [id number]  MEDICAL RECORD NO.:  [id number]           PATIENT TYPE:  O  LOCATION:  XRAY                         FACILITY:  [facility]  PHYSICIAN:  Sheliah Plane, MD    DATE OF BIRTH:  [birth year]  DATE OF ADMISSION:  06/20/2010 DATE OF DISCHARGE:  06/24/2010                              DISCHARGE SUMMARY   HISTORY:  The patient is a 70 year old male who for the past 4-5 years has had some substernal discomfort with exertion associated with shortness of breath.  Approximately 5 months ago, he noted the episodes to be increasing with associated jaw discomfort with exertion.  He had a negative stress test at [facility] in the fall of 2011 but because of increasing symptoms, especially one profound episode in February 2012, he sought further medical attention.  An ultrasound of the gallbladder was obtained and this revealed gallstones.  He was referred to a general surgeon who did not think his symptoms were related to gallbladder disease and ultimately he was referred to Dr. Sherryl Manges, and he underwent cardiac catheterization.  He had no previous cardiac history documented other than being diagnosed with pericarditis in 1978.  He has had no previous angioplasty or cardiac surgery.  The catheterization revealed that the patient had preserved left ventricular function.  He had an 80% proximal LAD lesion with diffuse distal disease.  There is diffuse disease in the right coronary artery that did involve the posterior descending.  He has more proximal circumflex and obtuse marginal disease.  The patient does have a significant amount of distal disease, but they did appear to be amendable to surgical revascularization and consultation was obtained with Sheliah Plane, MD who evaluated the patient and his studies and agreed with recommendations to proceed with coronary artery bypass grafting.  PAST MEDICAL HISTORY:  Includes  cholelithiasis.  ALLERGIES:  Include pollens but no  medication allergies.  PAST SURGICAL HISTORY:  Includes traumatic amputation of the left middle finger.  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  He is married.  He does not use tobacco or alcohol.  FAMILY HISTORY:  The patient's father is deceased at age 55 with congestive heart failure and emphysema.  His mother died at childbirth at age 69.  She did have a previous history of scarlet fever.  He has one brother who is healthy and one half-sister and uncle and aunt on his mother's side who lived into their late 51s.  MEDICATIONS PRIOR TO ADMISSION: 1. Nitroglycerin sublingual p.r.n. 2. Tylenol p.r.n. 3. Excedrin PM p.r.n. 4. Toprol-XL 25 mg daily. 5. Imdur or 30 mg daily. 6. Nexium 40 mg daily. 7. Aspirin 81 mg daily.  REVIEW OF SYMPTOMS AND PHYSICAL EXAMINATION:  Please see the dictated history and physical done at the time of admission.  HOSPITAL COURSE:  The patient was admitted electively and on June 20, 2010, he was taken to the operating room where he underwent the following procedure:  Coronary artery bypass grafting x6.  The following grafts were placed. 1. Left internal mammary artery to the LAD.  2. Sequential saphenous vein graft to obtuse marginal #1 and obtuse     marginal #2. 3. Graft was a sequential saphenous vein graft to the mid posterior     descending and distal posterior descending coronary artery.  He     tolerated the procedure well and was taken to the surgical     intensive care unit in stable condition.  POSTOPERATIVE HOSPITAL COURSE:  The patient has progressed quite nicely. Inotropic support was weaned without difficulty.  He was extubated using standard protocols.  He remains neurologically intact.  All routine lines, drains and devices have been discontinued using standard protocols.  He has tolerated a gradual increase in activities  using standard cardiac surgical protocols.  He has had no significant cardiac dysrhythmias.  He did develop a leukocytosis early postoperatively with a white blood cell count 16.6, but this is improved with time with most recent at 12,000 on June 23, 2010.  He has a mild acute blood loss anemia.  Most recent hemoglobin and hematocrit dated June 23, 2010 are 11.4 and 33 respectively.  Renal function is within normal limits and most recent creatinine is 0.8 on June 23, 2010.  His incisions are healing well without evidence of infection.  Oxygen has been weaned and he maintains good saturations on room air.  He does have a moderate postoperative volume overload but is responding well to diuretics which will be continued a short time postdischarge.  Currently, his status is felt to stable for tentative discharge in the next 1-2 days, pending further evaluation.  Medications at this time for discharge will be as follows: 1. Aspirin enteric-coated 325 mg tablet 1 p.o. daily. 2. Lasix 40 mg daily for an additional 5 days. 3. Mucinex 600 mg LA tablet 1 every 12 hours p.r.n. 4. Oxycodone 5 mg IR tablet 1-2 every 3 hours p.r.n. for pain. 5. Potassium chloride 20 mEq daily for 5 additional days. 6. Crestor 20 mg p.o. daily. 7. Excedrin p.r.n. 8. Flonase 2 sprays nasally daily. 9. Metoprolol XL 25 mg daily. 10.Nexium 40 mg daily.  Followup include appointment to see Dr. Tyrone Sage on July 20, 2010, at 11:30 with a chest x-ray from [facility].  He is also instructed to see his cardiologist in 2 weeks, Dr. Antoine Poche.  FINAL DIAGNOSES:  Include severe multivessel coronary artery disease as described, now status post surgical revascularization also as described.  OTHER DIAGNOSES:  Include history of cholelithiasis; postoperative acute blood loss anemia, mild; postoperative volume overload, resolving.  INSTRUCTIONS:  The patient will receive written instructions regarding medications,  activity, diet, wound care and followup.  He will also be thoroughly instructed per cardiac rehabilitation standard protocols.     Rowe Clack, P.A.-C.   ______________________________ Sheliah Plane, MD    WEG/MEDQ  D:  06/23/2010  T:  06/23/2010  Job:  [id number]  cc:   Rollene Rotunda, MD, [facility]  Electronically Signed by Gershon Crane P.A.-C. on 07/25/2010 01:27:48 PM Electronically Signed by Sheliah Plane MD on 07/31/2010 12:23:56 PM

## 2010-07-03 ENCOUNTER — Ambulatory Visit (INDEPENDENT_AMBULATORY_CARE_PROVIDER_SITE_OTHER): Payer: Medicare Other | Admitting: Cardiology

## 2010-07-03 ENCOUNTER — Encounter: Payer: Self-pay | Admitting: Cardiology

## 2010-07-03 VITALS — BP 132/64 | HR 85 | Ht 72.0 in | Wt 188.8 lb

## 2010-07-03 DIAGNOSIS — Z951 Presence of aortocoronary bypass graft: Secondary | ICD-10-CM

## 2010-07-03 DIAGNOSIS — E785 Hyperlipidemia, unspecified: Secondary | ICD-10-CM

## 2010-07-03 DIAGNOSIS — I25810 Atherosclerosis of coronary artery bypass graft(s) without angina pectoris: Secondary | ICD-10-CM

## 2010-07-03 MED ORDER — LISINOPRIL 5 MG PO TABS: 5.0000 mg | ORAL_TABLET | Freq: Every day | ORAL | Status: DC

## 2010-07-03 MED ORDER — METOPROLOL SUCCINATE ER 50 MG PO TB24: 50.0000 mg | ORAL_TABLET | Freq: Every day | ORAL | Status: DC

## 2010-07-03 MED ORDER — ROSUVASTATIN CALCIUM 20 MG PO TABS: 20.0000 mg | ORAL_TABLET | Freq: Every day | ORAL | Status: DC

## 2010-07-03 NOTE — Patient Instructions (Signed)
Stop nexium.  Start Lisinopril 5mg  daily.  Increase Toprol XL  to 50mg  daily. You can take two 25mg  tablets daily.  Lab in 2 weeks--BMP 414.05--you have the order. Please fax the results to [phone].  Dr Shirlee Latch has referred you to cardiac rehab ([facility]) at [facility].  Schedule an appointment to see Dr Shirlee Latch in 2 months.( June 2012)

## 2010-07-04 DIAGNOSIS — I25810 Atherosclerosis of coronary artery bypass graft(s) without angina pectoris: Secondary | ICD-10-CM | POA: Insufficient documentation

## 2010-07-04 NOTE — Progress Notes (Signed)
PCP: Dr. Pablo Lawrence  70 yo with history of CAD s/p recent CABG returns for cardiology followup post-operatively.  Patient presented earlier this year with chest pain concerning for angina.  Left heart cath showed severe 3 vessel disease with preserved LV ejection fraction.  He had CABG x 5 in 3/12.  He has done quite well since discharge.  No chest pain and no exertional shortness of breath.  He has been walking up to 1/2 mile with his wife without problems.  Main complaint is that he notes an elevated heart rate at times, especially when he lies down at night.  He did go to the ER once and was noted to have sinus tachycardia with rate in the low 100s.  Currently, HR is 85 (sinus rhythm).   ECG: NSR, LAE, LAFB  Labs (4/12): K 3.9, creatinine 0.96, HCT 37.9  PMH: 1. CAD: Presented with anginal-type chest pain.  LHC (3/12) with 95% pLAD, 70% large D1, 99% mid CFX, 70% OM2, subtotalled PDA, EF 65%.  Patient had CABG in 3/12 Tyrone Sage) with LIMA-LAD, SVG-D, sequential SVG-OM1 and OM2, sequential SVG-prox and mid PDA.   2. Cholelithiasis 3. Traumatic amputation left middle finger 4. Hyperlipidemia 5. H/o pericarditis at age 67  SH: Retired Research officer, trade union.  Married, lives in [place].  Nonsmoker, rare ETOH.   FH: Father with CHF, mother died in childbirth.  ROS: All systems reviewed and negative except as per HPI.   Current Outpatient Prescriptions  Medication Sig Dispense Refill  . aspirin 325 MG tablet Take 325 mg by mouth daily.        . fluticasone (FLONASE) 50 MCG/ACT nasal spray       . rosuvastatin (CRESTOR) 20 MG tablet Take 1 tablet (20 mg total) by mouth daily.  90 tablet  3  . lisinopril (PRINIVIL,ZESTRIL) 5 MG tablet Take 1 tablet (5 mg total) by mouth daily.  90 tablet  3  . metoprolol (TOPROL XL) 50 MG 24 hr tablet Take 1 tablet (50 mg total) by mouth daily.  90 tablet  3    BP 132/64  Pulse 85  Ht 6' (1.829 m)  Wt 188 lb 12.8 oz (85.639 kg)  BMI 25.61 kg/m2 General: NAD Neck: No  JVD, no thyromegaly or thyroid nodule.  Lungs: Clear to auscultation bilaterally with normal respiratory effort. CV: Nondisplaced PMI.  Heart regular S1/S2, no S3/S4, no murmur.  No peripheral edema.  No carotid bruit.  Normal pedal pulses.  Abdomen: Soft, nontender, no hepatosplenomegaly, no distention.  Neurologic: Alert and oriented x 3.  Psych: Normal affect. Extremities: No clubbing or cyanosis.

## 2010-07-04 NOTE — Assessment & Plan Note (Signed)
Check lipids/LFTs in 2 months.  Goal LDL < 70.

## 2010-07-04 NOTE — Assessment & Plan Note (Signed)
Patient is doing well s/p CABG in 3/12.  No ischemic symptoms.  Given occasional episodes where he feels an elevated heart rate, I am going to increase Toprol XL to 50 mg daily.  I will also start him on a low dose of ACEI, lisinopril 5 mg daily.  He will continue on Crestor and aspirin.  BMET 2 weeks after starting lisinopril (can be done at PCP's office).  Finally, he will start cardiac rehab.  Since he lives in [place], he will do this at the hospital in [facility].

## 2010-07-07 ENCOUNTER — Telehealth: Payer: Self-pay | Admitting: Cardiology

## 2010-07-07 NOTE — Telephone Encounter (Signed)
All Cardiac faxed to [facility] @ [phone] 07/07/10/KM

## 2010-07-10 ENCOUNTER — Encounter: Payer: Medicare Other | Admitting: Cardiology

## 2010-07-18 ENCOUNTER — Encounter: Payer: Self-pay | Admitting: Cardiology

## 2010-07-19 ENCOUNTER — Other Ambulatory Visit: Payer: Self-pay | Admitting: Cardiothoracic Surgery

## 2010-07-19 DIAGNOSIS — I2510 Atherosclerotic heart disease of native coronary artery without angina pectoris: Secondary | ICD-10-CM

## 2010-07-20 ENCOUNTER — Ambulatory Visit
Admission: RE | Admit: 2010-07-20 | Discharge: 2010-07-20 | Disposition: A | Discharge status: Home / Self Care | Payer: Medicare Other | Source: Ambulatory Visit | Attending: Cardiothoracic Surgery | Admitting: Cardiothoracic Surgery

## 2010-07-20 ENCOUNTER — Ambulatory Visit (INDEPENDENT_AMBULATORY_CARE_PROVIDER_SITE_OTHER): Payer: Self-pay | Admitting: Cardiothoracic Surgery

## 2010-07-20 DIAGNOSIS — I2510 Atherosclerotic heart disease of native coronary artery without angina pectoris: Secondary | ICD-10-CM

## 2010-07-20 NOTE — Assessment & Plan Note (Signed)
OFFICE VISIT  Omar Bridges, Omar Bridges DOB:  09-Oct-1940                                        July 20, 2010 CHART #:  [id number]  The patient returns to the office today in followup after his coronary artery bypass grafting x6 done on June 20, 2010.  He has made excellent progress postoperatively starting in cardiac rehab program in [facility] on Monday.  He has had no recurrent angina or evidence of congestive heart failure.  He is increasing his activity appropriately.  He did note that he had blood work done in the [facility] and was called back and told not to eat carbohydrates.  I will see him on this and his glucose was elevated, should be noted that preoperatively his hemoglobin A1c was 6.0.  On exam; his blood pressure is 118/80, pulse is 92, respiratory rate 20 and O2 sats 97%.  He is afebrile.  His sternum is stable and well healed.  His lungs are clear bilaterally.  His lower extremities are without edema.  His right leg EndoVein harvest site is also healing well.  Followup chest x-ray shows very small left pleural effusion, otherwise clear.  His medication list and epic is reviewed, though there are some corrections to what is there.  He continues on: 1. Aspirin 325 mg a day. 2. Flonase 50 mcg nasal spray once a day. 3. Lisinopril 5 mg a day. 4. Metoprolol, he is currently taking 25 mg once a day rather than     what is listed at 50 mg a day. 5. He is also on Crestor 20 mg a day, not listed. 6. Fish oil and vitamin D once a day.  Overall, I am very pleased with his progress.  I encouraged him to enroll to continue complete the cardiac rehab program.  I have allowed him to return to driving and warned him about any heavy lifting for 3 months.  Overall, I am very pleased with his progress.  Sheliah Plane, MD Electronically Signed  EG/MEDQ  D:  07/20/2010  T:  07/20/2010  Job:  [id number]  cc:   Marca Ancona, MD Isabel[facility] Course

## 2010-08-16 NOTE — Cardiovascular Report (Signed)
  NAME:  Omar Bridges, Omar Bridges               ACCOUNT NO.:  [id number]  MEDICAL RECORD NO.:  [id number]           PATIENT TYPE:  O  LOCATION:  CATH                         FACILITY:  [facility]  PHYSICIAN:  Rollene Rotunda, MD, FACCDATE OF BIRTH:  06-20-1940  DATE OF PROCEDURE:  06/09/2010 DATE OF DISCHARGE:                           CARDIAC CATHETERIZATION   PRIMARY PHYSICIAN:  Dr. Isabelle Course.  PROCEDURE:  Left heart catheterization/coronary arteriography.  INDICATION:  Evaluate patient with unstable angina.  PROCEDURE NOTE:  Left heart catheterization was performed via the right femoral artery.  The artery was cannulated using the anterior wall puncture.  A #4-French arterial sheath was inserted via the modified Seldinger technique.  Preformed Judkins and pigtail catheter were utilized.  The patient tolerated the procedure well and left the lab in stable condition.  RESULTS:  Hemodynamics:  LV 133/15, AO 133/92.  Coronaries, the left main had 25% stenosis.  The LAD had severe proximal 70-80% stenosis with a focal 95% lesion at the takeoff of the first diagonal.  There was diffuse high-grade apical and distal disease.  The first diagonal was large with proximal 70% stenosis.  The second diagonal was small and branching with diffuse disease.  The circumflex in the AV groove had a 99% stenosis after the first obtuse marginal before a large second obtuse marginal.  First obtuse marginal was large with proximal long 50- 60% stenosis.  The second obtuse marginal was large with distal diffuse 70% stenosis.  The right coronary artery is large dominant vessel. There was long proximal 40% stenosis.  PDA was large vessel with proximal subtotal stenosis and mid diffuse 80-90% stenosis.  Left ventriculogram:  The left ventriculogram was obtained in the RAO projection.  The EF was 65% with normal wall motion.  CONCLUSION:  Three-vessel coronary artery disease.  Preserved ejection fraction.  PLAN:   I will refer the patient for CABG.     Rollene Rotunda, MD, [facility]     JH/MEDQ  D:  06/09/2010  T:  06/10/2010  Job:  [id number]  Electronically Signed by Rollene Rotunda MD [facility] on 07/21/2010 11:41:41 AM

## 2010-08-16 NOTE — Consult Note (Signed)
NAME:  Omar Bridges, Omar Bridges NO.:  [id number]  MEDICAL RECORD NO.:  [id number]           PATIENT TYPE:  O  LOCATION:  CATH                         FACILITY:  [facility]  PHYSICIAN:  Sheliah Plane, MD    DATE OF BIRTH:  04-22-1940  DATE OF CONSULTATION:  06/15/2010 DATE OF DISCHARGE:                                CONSULTATION   REQUESTING PHYSICIAN:  Rollene Rotunda, MD, [facility]  FOLLOWUP CARDIOLOGIST:  Rollene Rotunda, MD, [facility]  PRIMARY CARE PHYSICIAN:  Dr. Isabelle Course  REASON FOR CONSULTATION:  Coronary occlusive disease.  HISTORY OF PRESENT ILLNESS:  The patient is a 70 year old male who for the past 4-5 years has had some substernal discomfort with exertion with mild shortness of breath; however, approximately 5 months ago he noted increasing episodes of dyspnea and jaw discomfort with exertion.  He had negative stress test at [facility] in the fall of 2011, but because of increasing symptoms especially 1 profound episode on May 16, 2010, he sought further medical attention.  Ultrasound of the gallbladder showed gallstones.  He was referred to a general surgeon who did not think his symptoms were related to gallbladder disease and ultimately he was referred to Dr. Graciela Husbands who performed a cardiac catheterization last week.  He notes that his symptoms come on with exertion and mostly a choking sensation.  He has had no previous cardiac history documented other than being diagnosed in 1978 with pericarditis and ultimately underwent cardiac catheterization at [facility].  He has had no previous angioplasty or cardiac surgery.  CARDIAC RISK FACTORS:  The patient denies diabetes.  Denies smoking. Does have mild hyperlipidemia, but has never been treated.  Denies hypertension.  Denies stroke, claudication, or renal insufficiency.  PAST MEDICAL HISTORY:  Significant for cholelithiasis and pollen allergies.  PAST SURGICAL HISTORY:  Traumatic amputation  of left middle finger.  SOCIAL HISTORY:  The patient is retired Research officer, trade union, still occasionally works part-time.  Married, lives with his wife who was former employee of [employer].  FAMILY HISTORY:  The patient's father died at age 19 with congestive heart failure and emphysema after long life with dust exposure.  His mother died at childbirth at age 50.  She had a previous history of scarlet fever.  He has one half-brother who is healthy and one half- sister, an uncle and an aunt on his mother's side who lived until their late 24s.  CURRENT MEDICATIONS: 1. Sublingual nitroglycerin p.r.n. 2. Tylenol. 3. Excedrin PM p.r.n. 4. Toprol-XL 25 a day. 5. Imdur 30 mg a day. 6. Nexium 40 mg a day. 7. Aspirin 81 mg a day.  ALLERGIES:  He has no known drug allergies.  Cardiac review of systems is positive for chest pain, exertional shortness of breath, orthopnea.  Denies lower extremity edema.  Denies palpitation.  Denies resting shortness of breath, syncope, or presyncope.  General review of systems, he has had a 5-6 pound weight gain over the past 3 months because of decreased activity.  Denies amaurosis or TIAs.  Denies hemoptysis or wheezing.  Denies abdominal discomfort.  Does have known gallstones.  He has  had a history of double vision 10 years ago.  MRI was performed.  It resolved after 6-8 weeks and was diagnosed as trigeminal neuralgia.  He has had a colonoscopy 2 years ago.  Denies psychiatric history.  Other review of systems are negative.  PHYSICAL EXAMINATION:  VITAL SIGNS:  The patient's blood pressure 107/59, pulse 61, respiratory rate 20, O2 sats 96%.  He is 72 inches tall, 199 pounds, BMI is 27. GENERAL:  The patient is alert, neurologically intact. NECK:  He has no carotid bruits. LUNGS:  Clear bilaterally. CARDIAC:  Regular rate and rhythm without murmur or gallop. ABDOMEN:  Benign without palpable masses or tenderness. EXTREMITIES:  Lower extremities have  2+ DP and PT pulses bilaterally and appear to have adequate vein for bypassing both lower extremities.  Laboratory findings include white count of 12.3, hematocrit of 48.6, hemoglobin 16.8, dated June 05, 2010.  Creatinine is 1.0, glucose 99.  Cardiac catheterization films are reviewed.  The patient has preserved LV function.  He has 80% proximal LAD lesion with diffuse distal disease.  There is diffuse distal disease in the right coronary artery that may not be bypassable involving the posterior descending.  He has more proximal circ and OM disease.  The patient does have significant amount of distal disease, but there do appear to be sufficient targets in each of the major vessels to improve his symptoms, although especially the distal right is not ideal for bypass.  IMPRESSION:  Exertional chest pain related to coronary occlusive disease.  I have agreed with Dr. Jenene Slicker recommendation to proceed with coronary artery bypass grafting.  I have discussed this with the patient and his wife in detail.  The risks of surgery including death, infection, stroke, myocardial infarction, bleeding, blood transfusion have all been discussed in detail.  Reviewed with him the coronary anatomy and the issue with the distal disease.  He is willing to proceed.  We will tentatively plan for early next week June 20, 2010.     Sheliah Plane, MD     EG/MEDQ  D:  06/15/2010  T:  06/16/2010  Job:  [id number]  cc:   Rollene Rotunda, MD, [facility]  Electronically Signed by Sheliah Plane MD on 06/22/2010 03:39:42 PM

## 2010-08-24 ENCOUNTER — Encounter: Payer: Self-pay | Admitting: Cardiology

## 2010-09-05 ENCOUNTER — Ambulatory Visit (INDEPENDENT_AMBULATORY_CARE_PROVIDER_SITE_OTHER): Payer: Medicare Other | Admitting: Cardiology

## 2010-09-05 ENCOUNTER — Encounter: Payer: Self-pay | Admitting: Cardiology

## 2010-09-05 VITALS — BP 102/58 | HR 65 | Ht 72.0 in | Wt 189.0 lb

## 2010-09-05 DIAGNOSIS — I25810 Atherosclerosis of coronary artery bypass graft(s) without angina pectoris: Secondary | ICD-10-CM

## 2010-09-05 DIAGNOSIS — E785 Hyperlipidemia, unspecified: Secondary | ICD-10-CM

## 2010-09-05 NOTE — Patient Instructions (Signed)
Dr Shirlee Latch recommends you have a fasting lipid profile/liver profile/BMP done--you have the order.  Your physician wants you to follow-up in: 6 months with Dr Shirlee Latch.(December 2012). You will receive a reminder letter in the mail two months in advance. If you don't receive a letter, please call our office to schedule the follow-up appointment.

## 2010-09-06 NOTE — Progress Notes (Signed)
PCP: Dr. Pablo Lawrence  70 yo with history of CAD s/p recent CABG returns for cardiology followup.  Patient presented earlier this year with chest pain concerning for unstable angina.  Left heart cath showed severe 3 vessel disease with preserved LV ejection fraction.  He had CABG x 5 in 3/12.  He has done quite well since discharge.  No chest pain and no exertional shortness of breath.  He is doing cardiac rehab at the hospital in [facility].    ECG: NSR, LAFB, rSR' pattern  Labs (4/12): K 3.9, creatinine 0.96, HCT 37.9  PMH: 1. CAD: Presented with anginal-type chest pain.  LHC (3/12) with 95% pLAD, 70% large D1, 99% mid CFX, 70% OM2, subtotalled PDA, EF 65%.  Patient had CABG in 3/12 Tyrone Sage) with LIMA-LAD, SVG-D, sequential SVG-OM1 and OM2, sequential SVG-prox and mid PDA.   2. Cholelithiasis 3. Traumatic amputation left middle finger 4. Hyperlipidemia 5. H/o pericarditis at age 16  SH: Retired Research officer, trade union.  Married, lives in [place].  Nonsmoker, rare ETOH.   FH: Father with CHF, mother died in childbirth.  Current Outpatient Prescriptions  Medication Sig Dispense Refill  . aspirin 325 MG tablet Take 325 mg by mouth daily.        Marland Kitchen esomeprazole (NEXIUM) 40 MG capsule Take 40 mg by mouth daily before breakfast.        . lisinopril (PRINIVIL,ZESTRIL) 5 MG tablet Take 1 tablet (5 mg total) by mouth daily.  90 tablet  3  . metoprolol (TOPROL-XL) 50 MG 24 hr tablet Take 25 mg by mouth daily.       . nitroGLYCERIN (NITROSTAT) 0.4 MG SL tablet Place 0.4 mg under the tongue. As needed/ as directed for chest pain.       . rosuvastatin (CRESTOR) 20 MG tablet Take 1 tablet (20 mg total) by mouth daily.  90 tablet  3    BP 102/58  Pulse 65  Ht 6' (1.829 m)  Wt 189 lb (85.73 kg)  BMI 25.63 kg/m2 General: NAD Neck: No JVD, no thyromegaly or thyroid nodule.  Lungs: Clear to auscultation bilaterally with normal respiratory effort. CV: Nondisplaced PMI.  Heart regular S1/S2, no S3/S4, no murmur.   No peripheral edema.  No carotid bruit.  Normal pedal pulses.  Abdomen: Soft, nontender, no hepatosplenomegaly, no distention.  Neurologic: Alert and oriented x 3.  Psych: Normal affect. Extremities: No clubbing or cyanosis.

## 2010-09-06 NOTE — Assessment & Plan Note (Signed)
Check lipids/LFTs with goal LDL < 70.  

## 2010-09-06 NOTE — Assessment & Plan Note (Signed)
Patient is doing well s/p CABG in 3/12.  No ischemic symptoms.  He will continue ASA, Toprol, lisinopril, and Crestor.  He will continue cardiac rehab.

## 2010-09-11 ENCOUNTER — Encounter: Payer: Self-pay | Admitting: Cardiology

## 2010-10-04 ENCOUNTER — Encounter: Payer: Self-pay | Admitting: Cardiology

## 2010-10-11 ENCOUNTER — Telehealth: Payer: Self-pay | Admitting: Cardiology

## 2010-10-11 NOTE — Telephone Encounter (Signed)
ROI received Via Mail from Pt, Mailed LOV,12 lead to Pt.  10/11/10/km

## 2011-02-28 ENCOUNTER — Encounter: Payer: Self-pay | Admitting: Cardiology

## 2011-02-28 ENCOUNTER — Ambulatory Visit (INDEPENDENT_AMBULATORY_CARE_PROVIDER_SITE_OTHER): Payer: Medicare Other | Admitting: Cardiology

## 2011-02-28 VITALS — BP 110/58 | HR 63 | Ht 72.0 in | Wt 192.8 lb

## 2011-02-28 DIAGNOSIS — E785 Hyperlipidemia, unspecified: Secondary | ICD-10-CM

## 2011-02-28 DIAGNOSIS — I25810 Atherosclerosis of coronary artery bypass graft(s) without angina pectoris: Secondary | ICD-10-CM

## 2011-02-28 LAB — BASIC METABOLIC PANEL
BUN: 27 mg/dL — ABNORMAL HIGH (ref 6–23)
CO2: 27 mEq/L (ref 19–32)
Chloride: 107 mEq/L (ref 96–112)
Glucose, Bld: 103 mg/dL — ABNORMAL HIGH (ref 70–99)
Potassium: 4.2 mEq/L (ref 3.5–5.1)

## 2011-02-28 MED ORDER — ATORVASTATIN CALCIUM 80 MG PO TABS: 80.0000 mg | ORAL_TABLET | Freq: Every day | ORAL | Status: DC

## 2011-02-28 NOTE — Assessment & Plan Note (Signed)
Patient is doing well s/p CABG in 3/12.  No ischemic symptoms.  He will continue ASA, Toprol, lisinopril, and statin.  Continue to exercise.  I will check a BMET today as K has been borderline high.

## 2011-02-28 NOTE — Assessment & Plan Note (Signed)
Crestor is expensive for him.  I will stop Crestor and have him start atorvastatin 80 mg daily with lipids/LFTs in 2 months (goal LDL < 70).

## 2011-02-28 NOTE — Patient Instructions (Signed)
Stop crestor.  Start atorvastatin 80mg  daily.  Lab today--BMET 414.05  Your physician recommends that you return for a FASTING lipid profile /liver profile in 2 months --you have the order. Please fax the results to Dr Shirlee Latch [phone]  Your physician wants you to follow-up in: 6 months with Dr Shirlee Latch. (June 2013). You will receive a reminder letter in the mail two months in advance. If you don't receive a letter, please call our office to schedule the follow-up appointment.

## 2011-02-28 NOTE — Progress Notes (Signed)
PCP: Dr. Pablo Lawrence  70 yo with history of CAD s/p recent CABG returns for cardiology followup.  Patient presented earlier this year with chest pain concerning for unstable angina.  Left heart cath showed severe 3 vessel disease with preserved LV ejection fraction.  He had CABG x 5 in 3/12.  He has done quite well since discharge.  No chest pain and no exertional shortness of breath.  He has completed cardiac rehab and is now working out at the [facility] 1-2 times a week on the treadmill and stationary bike. He has been deer hunting several times this fall.     ECG: NSR, LAFB, rSR' pattern  Labs (4/12): K 3.9, creatinine 0.96, HCT 37.9 Labs (6/12): K 5.1, creatinine 0.9, LFTs, normal, LDL 44, HDL 42  PMH: 1. CAD: Presented with anginal-type chest pain.  LHC (3/12) with 95% pLAD, 70% large D1, 99% mid CFX, 70% OM2, subtotalled PDA, EF 65%.  Patient had CABG in 3/12 Tyrone Sage) with LIMA-LAD, SVG-D, sequential SVG-OM1 and OM2, sequential SVG-prox and mid PDA.   2. Cholelithiasis 3. Traumatic amputation left middle finger 4. Hyperlipidemia 5. H/o pericarditis at age 79  SH: Retired Research officer, trade union.  Married, lives in [place].  Nonsmoker, rare ETOH.   FH: Father with CHF, mother died in childbirth.  Current Outpatient Prescriptions  Medication Sig Dispense Refill  . aspirin 325 MG tablet Take 325 mg by mouth daily.        . cholecalciferol (VITAMIN D) 1000 UNITS tablet Take 1,000 Units by mouth 2 (two) times daily.        . fish oil-omega-3 fatty acids 1000 MG capsule Take 2 g by mouth daily.        Marland Kitchen lisinopril (PRINIVIL,ZESTRIL) 5 MG tablet Take 1 tablet (5 mg total) by mouth daily.  90 tablet  3  . metoprolol (TOPROL-XL) 50 MG 24 hr tablet Take 25 mg by mouth daily.       . nitroGLYCERIN (NITROSTAT) 0.4 MG SL tablet Place 0.4 mg under the tongue. As needed/ as directed for chest pain.       Marland Kitchen atorvastatin (LIPITOR) 80 MG tablet Take 1 tablet (80 mg total) by mouth daily.  90 tablet  3    BP 110/58   Pulse 63  Ht 6' (1.829 m)  Wt 87.454 kg (192 lb 12.8 oz)  BMI 26.15 kg/m2 General: NAD Neck: No JVD, no thyromegaly or thyroid nodule.  Lungs: Clear to auscultation bilaterally with normal respiratory effort. CV: Nondisplaced PMI.  Heart regular S1/S2, no S3/S4, no murmur.  No peripheral edema.  No carotid bruit.  Normal pedal pulses.  Abdomen: Soft, nontender, no hepatosplenomegaly, no distention.  Neurologic: Alert and oriented x 3.  Psych: Normal affect. Extremities: No clubbing or cyanosis.

## 2011-05-11 ENCOUNTER — Encounter: Payer: Self-pay | Admitting: Cardiology

## 2011-07-03 ENCOUNTER — Other Ambulatory Visit: Payer: Self-pay | Admitting: *Deleted

## 2011-07-03 DIAGNOSIS — I25810 Atherosclerosis of coronary artery bypass graft(s) without angina pectoris: Secondary | ICD-10-CM

## 2011-07-03 MED ORDER — LISINOPRIL 5 MG PO TABS: 5.0000 mg | ORAL_TABLET | Freq: Every day | ORAL | Status: DC

## 2011-09-05 ENCOUNTER — Ambulatory Visit (INDEPENDENT_AMBULATORY_CARE_PROVIDER_SITE_OTHER): Payer: Medicare Other | Admitting: Cardiology

## 2011-09-05 ENCOUNTER — Telehealth: Payer: Self-pay | Admitting: *Deleted

## 2011-09-05 ENCOUNTER — Encounter: Payer: Self-pay | Admitting: Cardiology

## 2011-09-05 VITALS — BP 108/56 | HR 68 | Ht 72.0 in | Wt 197.0 lb

## 2011-09-05 DIAGNOSIS — M25569 Pain in unspecified knee: Secondary | ICD-10-CM

## 2011-09-05 DIAGNOSIS — I25810 Atherosclerosis of coronary artery bypass graft(s) without angina pectoris: Secondary | ICD-10-CM

## 2011-09-05 DIAGNOSIS — E785 Hyperlipidemia, unspecified: Secondary | ICD-10-CM

## 2011-09-05 MED ORDER — METOPROLOL SUCCINATE ER 25 MG PO TB24: ORAL_TABLET | ORAL | Status: DC

## 2011-09-05 NOTE — Patient Instructions (Signed)
Decrease metoprolol succinate to 12.5mg  daily. This will be one-half 25mg  tablet daily.  Decrease aspirin to 162mg  daily. This will be two 81mg  tablets daily.  Take coenzyme Q10 200mg  daily.   You have been referred to Dr Teryl Lucy to evaluate your knee pain.  You have been referred to Dr Illene Regulus to reestablish with him for primary care.  Your physician wants you to follow-up in: 6 months with Dr Shirlee Latch. ( December 2013). You will receive a reminder letter in the mail two months in advance. If you don't receive a letter, please call our office to schedule the follow-up appointment.

## 2011-09-05 NOTE — Progress Notes (Signed)
Patient ID: Omar Bridges, male   DOB: [birth year], 71 y.o.   MRN: [id number] PCP: Dr. Pablo Lawrence  71 yo with history of CAD s/p recent CABG returns for cardiology followup.  Patient presented in 3/12 with chest pain concerning for unstable angina.  Left heart cath showed severe 3 vessel disease with preserved LV ejection fraction.  He had CABG x 5 in 3/12.    He has done quite well since discharge.  No chest pain and no exertional shortness of breath. He does report easy bleeding on a full-strength aspirin.  He has some achiness in his arms and is not sure whether this is arthritis or due to his statin.  It is mild.  His left knee has arthritis and is gradually becoming more painful.  It dose limit his exercise.  Finally, he does report easy fatiguability and wonders if this could be due to his medications.   ECG: NSR, LAFB, poor anterior R wave progression.   Labs (4/12): K 3.9, creatinine 0.96, HCT 37.9 Labs (6/12): K 5.1, creatinine 0.9, LFTs, normal, LDL 44, HDL 42 Labs (12/12): K 4.2, creatinine 0.9 Labs (2/13): LDL 51, HDL 40  PMH: 1. CAD: Presented with anginal-type chest pain.  LHC (3/12) with 95% pLAD, 70% large D1, 99% mid CFX, 70% OM2, subtotalled PDA, EF 65%.  Patient had CABG in 3/12 Tyrone Sage) with LIMA-LAD, SVG-D, sequential SVG-OM1 and OM2, sequential SVG-prox and mid PDA.   2. Cholelithiasis 3. Traumatic amputation left middle finger 4. Hyperlipidemia 5. H/o pericarditis at age 47  SH: Retired Research officer, trade union.  Married, lives in [place].  Nonsmoker, rare ETOH.   FH: Father with CHF, mother died in childbirth.  ROS: All systems reviewed and negative except as per HPI.   Current Outpatient Prescriptions  Medication Sig Dispense Refill  . atorvastatin (LIPITOR) 80 MG tablet Take 1 tablet (80 mg total) by mouth daily.  90 tablet  3  . cholecalciferol (VITAMIN D) 1000 UNITS tablet Take 1,000 Units by mouth daily.       . fish oil-omega-3 fatty acids 1000 MG capsule Take 1 g by  mouth daily.       Marland Kitchen levocetirizine (XYZAL) 5 MG tablet Take 5 mg by mouth every evening.      Marland Kitchen lisinopril (PRINIVIL,ZESTRIL) 5 MG tablet Take 1 tablet (5 mg total) by mouth daily.  90 tablet  3  . nitroGLYCERIN (NITROSTAT) 0.4 MG SL tablet Place 0.4 mg under the tongue. As needed/ as directed for chest pain.       Marland Kitchen DISCONTD: metoprolol (TOPROL-XL) 50 MG 24 hr tablet Take 25 mg by mouth daily.       Marland Kitchen aspirin EC 81 MG tablet Take 2 tablets daily for a total of 162mg  daily      . Coenzyme Q10 200 MG TABS Take 1 daily    0  . metoprolol succinate (TOPROL XL) 25 MG 24 hr tablet Take 1/2 tablet daily  45 tablet  3    BP 108/56  Pulse 68  Ht 6' (1.829 m)  Wt 89.359 kg (197 lb)  BMI 26.72 kg/m2 General: NAD Neck: No JVD, no thyromegaly or thyroid nodule.  Lungs: Clear to auscultation bilaterally with normal respiratory effort. CV: Nondisplaced PMI.  Heart regular S1/S2, no S3/S4, no murmur.  No peripheral edema.  No carotid bruit.  Normal pedal pulses.  Abdomen: Soft, nontender, no hepatosplenomegaly, no distention.  Neurologic: Alert and oriented x 3.  Psych: Normal affect. Extremities: No clubbing or cyanosis.

## 2011-09-05 NOTE — Telephone Encounter (Signed)
Dr Dion Saucier 09/17/11 @ 9:45 norins appt is pending

## 2011-09-05 NOTE — Assessment & Plan Note (Signed)
Some aches in his shoulders, ? Arthritis versus due to statin.  LDL was at goal when checked in 2/13 (< 70).  I will have him try coenzyme Q10 200 mg daily to see if this helps with the achiness (may help if due to statin).

## 2011-09-05 NOTE — Assessment & Plan Note (Signed)
No exertional chest pain or dyspnea, but he does fatigue easily.   He also notes easy bleeding on full-strength aspirin.  - Decrease Toprol XL to 12.5 mg daily to see if this helps with the fatigue.  - Decrease ASA dose to 162 mg daily given easy bleeding.  - Continue statin, ACEI.

## 2011-09-06 NOTE — Assessment & Plan Note (Signed)
This limits his ability to exercise.  He wants a referral to an orthopedic surgeon to look at his options.  I will provide.

## 2011-09-24 ENCOUNTER — Ambulatory Visit (INDEPENDENT_AMBULATORY_CARE_PROVIDER_SITE_OTHER): Payer: Medicare Other | Admitting: Internal Medicine

## 2011-09-24 ENCOUNTER — Encounter: Payer: Self-pay | Admitting: Internal Medicine

## 2011-09-24 VITALS — BP 104/62 | HR 80 | Temp 98.4°F | Resp 16 | Ht 72.0 in | Wt 192.0 lb

## 2011-09-24 DIAGNOSIS — I25810 Atherosclerosis of coronary artery bypass graft(s) without angina pectoris: Secondary | ICD-10-CM

## 2011-09-24 DIAGNOSIS — M25569 Pain in unspecified knee: Secondary | ICD-10-CM

## 2011-09-24 DIAGNOSIS — E785 Hyperlipidemia, unspecified: Secondary | ICD-10-CM

## 2011-09-24 MED ORDER — FLUTICASONE PROPIONATE 50 MCG/ACT NA SUSP: 2.0000 | Freq: Every day | NASAL | Status: DC

## 2011-09-24 NOTE — Progress Notes (Signed)
Subjective:    Patient ID: Omar Bridges, male    DOB: November 20, 1940, 71 y.o.   MRN: [id number]  HPI Omar Bridges presents to reestablish for primary care. He was last seen in 1998 and in the interim had been followed by Dr. Pablo Lawrence at [facility]. He now gets his cardiology care from [facility] and would like to consolidate his care in one system. He died have a black widow bite 2 weeks ago and had myalgias and pain. He was seen at urgent care and given shot of steroids. He had severe pain and he went to [facility] where he was given IV fluids, benzodiazepines and pain medication.   Past Medical History  Diagnosis Date  . ED (erectile dysfunction)   . Hyperlipidemia   . Cholelithiasis   . Chicken pox   . Allergy   . Heart disease     multi-vessel dz LD, Crc. PDA, OM  . Pericarditis 1978   Past Surgical History  Procedure Date  . Nasal septum surgery   . Amputation finger / thumb     Traumatic, left middle finger @ age 60  . Coronary artery bypass graft 4/12    6 vessel: LIMA- LAD, SVG OM, PDA, Circ   Family History  Problem Relation Age of Onset  . Coronary artery disease Neg Hx   . Diabetes Neg Hx   . Cancer Neg Hx   . COPD Father   . Heart disease Father    History   Social History  . Marital Status: Married    Spouse Name: N/A    Number of Children: 1  . Years of Education: 13   Occupational History  . embalmer     semi-retired   Social History Main Topics  . Smoking status: Never Smoker   . Smokeless tobacco: Former Neurosurgeon    Types: Chew    Quit date: 01/01/2010  . Alcohol Use: No  . Drug Use: No  . Sexually Active: No   Other Topics Concern  . 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 Married '62 -. 1 son - killed in MVA. 1 dtr - '64. 2 grandchildren. 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 Tries to have a regular exercise program.    Current Outpatient Prescriptions on File Prior to Visit    Medication Sig Dispense Refill  . aspirin EC 81 MG tablet Take 2 tablets daily for a total of 162mg  daily      . atorvastatin (LIPITOR) 80 MG tablet Take 1 tablet (80 mg total) by mouth daily.  90 tablet  3  . cholecalciferol (VITAMIN D) 1000 UNITS tablet Take 1,000 Units by mouth daily.       . Coenzyme Q10 200 MG TABS Take 1 daily    0  . fish oil-omega-3 fatty acids 1000 MG capsule Take 1 g by mouth daily.       Marland Kitchen lisinopril (PRINIVIL,ZESTRIL) 5 MG tablet Take 1 tablet (5 mg total) by mouth daily.  90 tablet  3  . metoprolol succinate (TOPROL XL) 25 MG 24 hr tablet Take 1/2 tablet daily  45 tablet  3  . nitroGLYCERIN (NITROSTAT) 0.4 MG SL tablet Place 0.4 mg under the tongue. As needed/ as directed for chest pain.       . flonase nasal spray          Review of Systems Constitutional:  Negative for fever,  chills, activity change and unexpected weight change.  HEENT:  Negative for hearing loss, ear pain, congestion, neck stiffness and postnasal drip. Negative for sore throat or swallowing problems. Negative for dental complaints.   Eyes: Negative for vision loss or change in visual acuity.  Respiratory: Negative for chest tightness and wheezing. Negative for DOE.   Cardiovascular: Negative for chest pain or palpitations. No decreased exercise tolerance Gastrointestinal: No change in bowel habit. No bloating or gas. No reflux or indigestion Genitourinary: Negative for urgency, frequency, flank pain and difficulty urinating. Nocturia 1 -2 Musculoskeletal: Negative for myalgias, back pain,  and gait problem. 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  Hematological: Negative for adenopathy.  Psychiatric/Behavioral: Negative for behavioral problems and dysphoric mood.       Objective:   Physical Exam Filed Vitals:   09/24/11 1018  BP: 104/62  Pulse: 80  Temp: 98.4 F (36.9 C)  Resp: 16   Wt Readings from Last 3  Encounters:  09/24/11 192 lb (87.091 kg)  09/05/11 197 lb (89.359 kg)  02/28/11 192 lb 12.8 oz (87.454 kg)    Gen'l: Well nourished well developed white male in no acute distress  HEENT: Head: Normocephalic and atraumatic. Right Ear: External ear normal. EAC/TM nl. Left Ear: External ear normal.  EAC/TM nl. Nose: Nose normal. Mouth/Throat: Oropharynx is clear and moist. Dentition - partial dentures. No buccal or palatal lesions. Posterior pharynx clear. Eyes: Conjunctivae and sclera clear. EOM intact. Pupils are equal, round, and reactive to light. Right eye exhibits no discharge. Left eye exhibits no discharge. Neck: Normal range of motion. Neck supple. No JVD present. No tracheal deviation present. No thyromegaly present.  Cardiovascular: Normal rate, regular rhythm, no gallop, no friction rub, no murmur heard.      Quiet precordium. 2+ radial and DP pulses . No carotid bruits. Chest w/o shirt not examined Pulmonary/Chest: Effort normal. No respiratory distress or increased WOB, no wheezes, no rales. No chest wall deformity or CVAT. Abdominal: Soft. Bowel sounds are normal in all quadrants. He exhibits no distension, no tenderness, no rebound or guarding, No heptosplenomegaly  Genitourinary:  deferred Musculoskeletal: Normal range of motion. He exhibits no edema and no tenderness.       Small and large joints without redness, synovial thickening or deformity. Full range of motion preserved about all small, median and large joints.  Lymphadenopathy:    He has no cervical or supraclavicular adenopathy.  Neurological: He is alert and oriented to person, place, and time. CN II-XII intact. DTRs 2+ and symmetrical biceps, radial and patellar tendons. Cerebellar function normal with no tremor, rigidity, normal gait and station.  Skin: Skin is warm and dry. No rash noted. No erythema.  Psychiatric: He has a normal mood and affect. His behavior is normal. Thought content normal.   Lab Results    Component Value Date   WBC 14.1* 06/27/2010   HGB 13.2 06/27/2010   HCT 37.9* 06/27/2010   PLT 334 06/27/2010   GLUCOSE 103* 02/28/2011   ALT 32 06/16/2010   AST 35 06/16/2010   NA 142 02/28/2011   K 4.2 02/28/2011   CL 107 02/28/2011   CREATININE 0.9 02/28/2011   BUN 27* 02/28/2011   CO2 27 02/28/2011   INR 1.36 06/20/2010   HGBA1C  Value: 6.0 (NOTE)  06/16/2010   Lipid panel done at [facility] May 10, 2011: Total cholesterol 109, HDL 40, LDL 51      Assessment & Plan:  Very nice gentleman who has reestablished for on-going primary care. He is advised to be seen by Internal Medicine every 6 months - can alternate with cardiology on 3 month schedule.

## 2011-09-25 NOTE — Assessment & Plan Note (Signed)
Currently stable w/o complaints of chest pain or decreased exercise capacity - limited only by his knees.   Plan Per Dr. Shirlee Latch  Continue present medical regimen  Continue risk reduction: good BP control and lipid management - last lab is excellent

## 2011-09-25 NOTE — Assessment & Plan Note (Signed)
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  Plan Per Dr. Dion Saucier

## 2011-09-25 NOTE — Assessment & Plan Note (Signed)
Good control on present regimen.  Plan Due for follow-up lipid panel in mid-August.

## 2011-10-22 IMAGING — CT CT ANGIO CHEST
2 of 8 series · 19 of 36 positions shown · IV contrast (APPLIED)
Comparison: None.

CLINICAL DATA: Chest pain and congestion.  Bypass surgery last
week.

CT ANGIOGRAPHY CHEST WITH CONTRAST
TECHNIQUE: Multidetector CT imaging of the chest was performed
using the standard protocol during bolus administration of
intravenous contrast.  Multiplanar CT image reconstructions
including MIPs were obtained to evaluate the vascular anatomy.
Contrast:  100 ml Omnipaque 300

[Series 9: pulm embolism 1.0 b25f thins · axial · 0.79mm/px · z∈[+852,+1122]mm · 18 of 302 slices shown]
[im 16/302  lung]
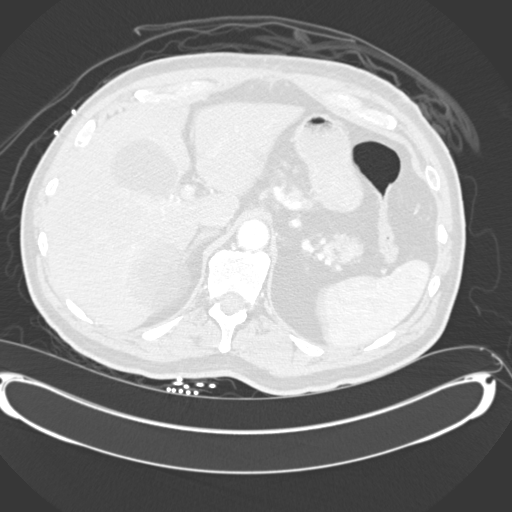
[im 32/302  mediastinal]
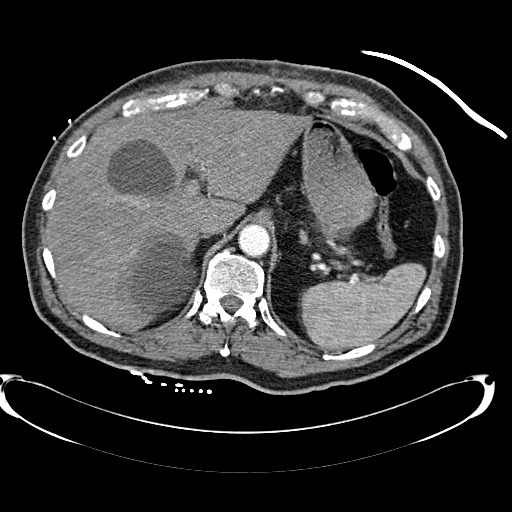
[im 48/302  lung]
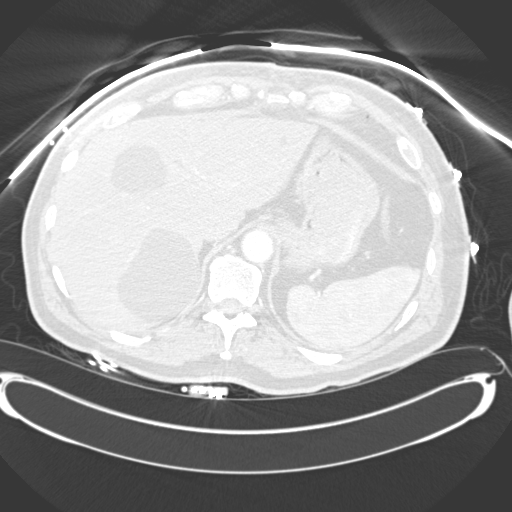
[im 64/302  mediastinal]
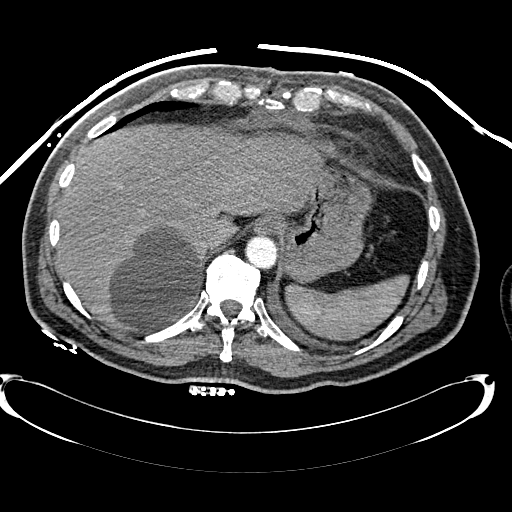
[im 80/302  lung]
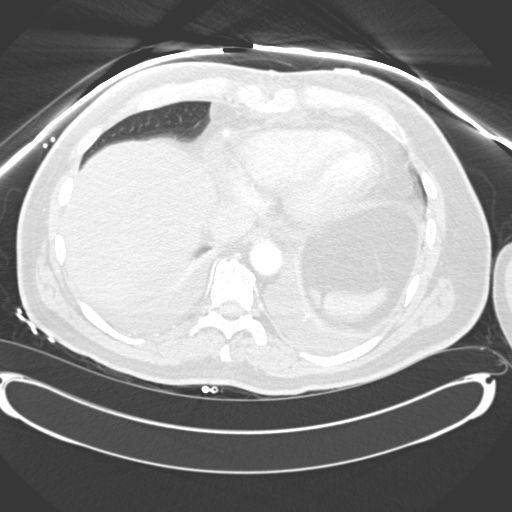
[im 96/302  mediastinal]
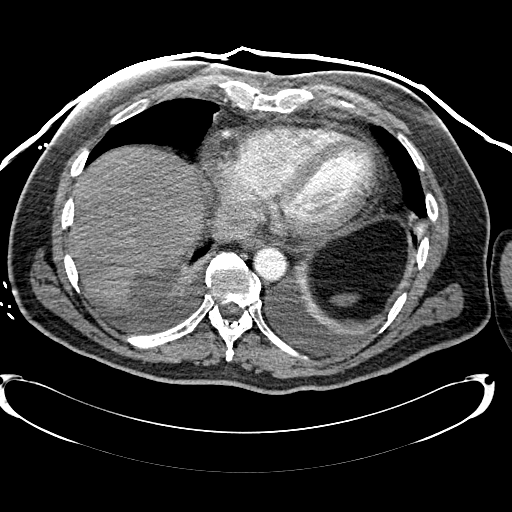
[im 111/302  lung]
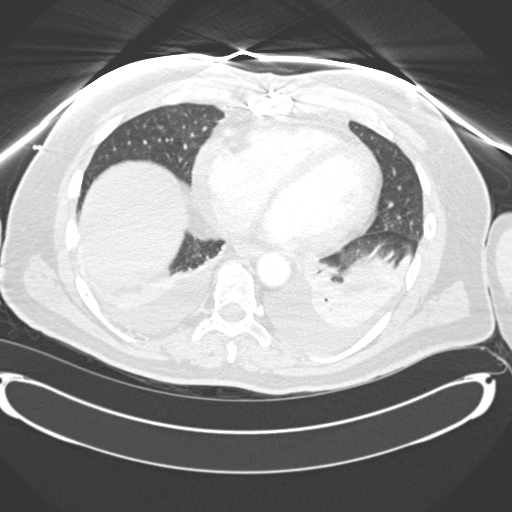
[im 127/302  mediastinal]
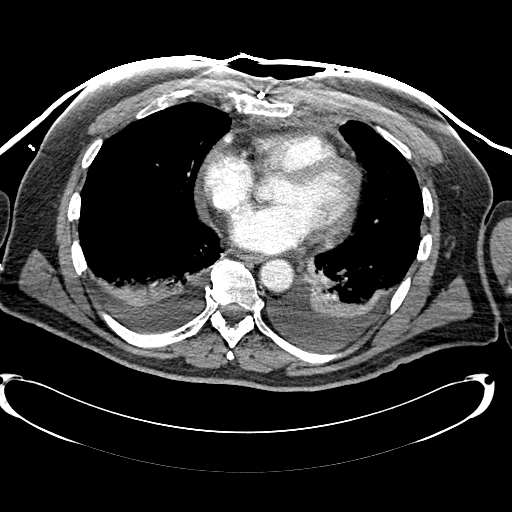
[im 143/302  lung]
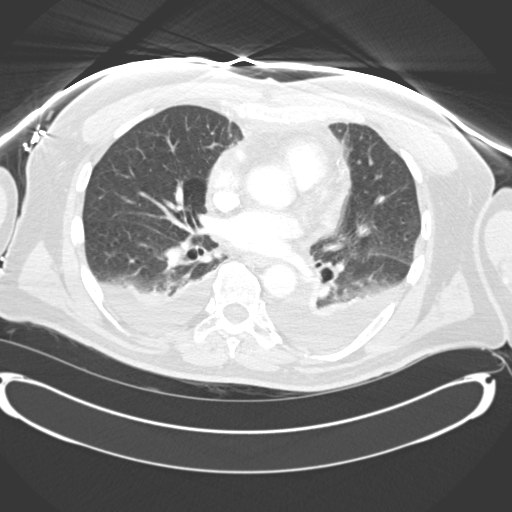
[im 159/302  mediastinal]
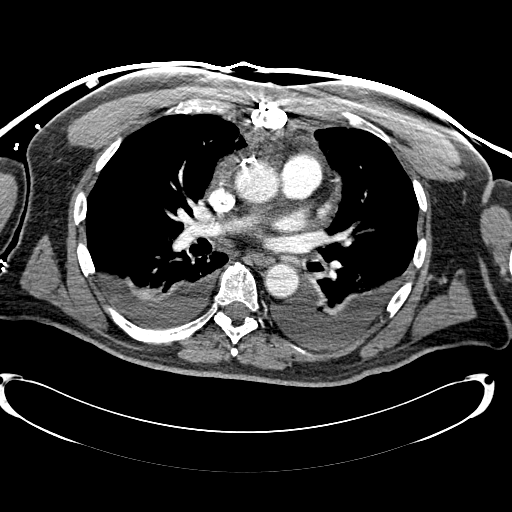
[im 175/302  lung]
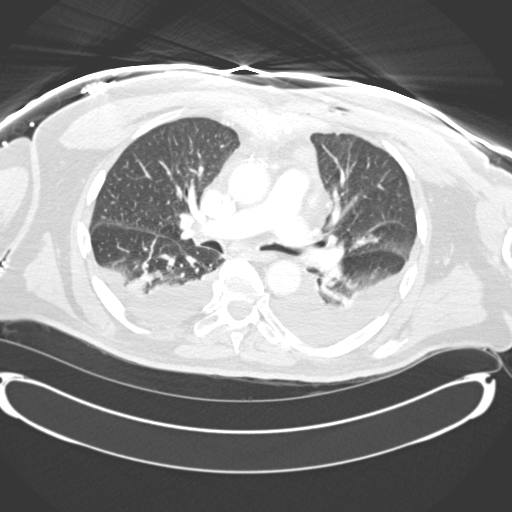
[im 191/302  mediastinal]
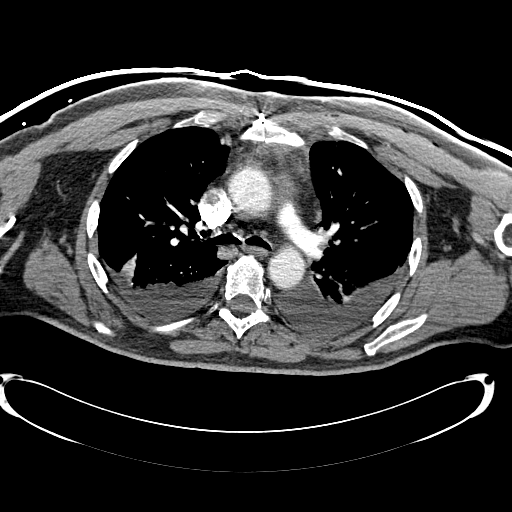
[im 206/302  lung]
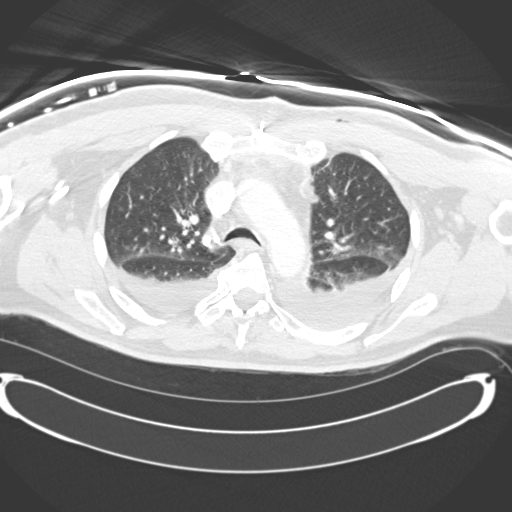
[im 222/302  mediastinal]
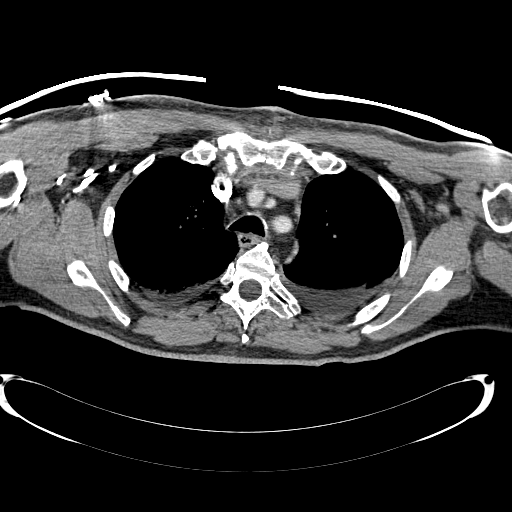
[im 238/302  lung]
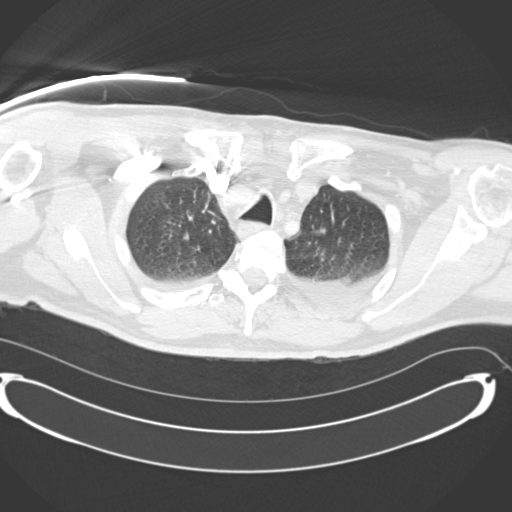
[im 254/302  mediastinal]
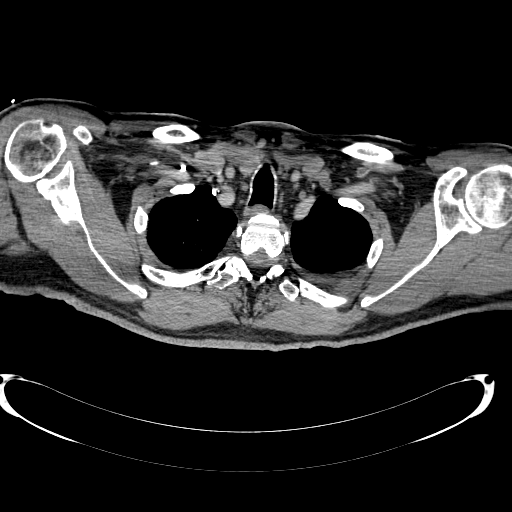
[im 270/302  lung]
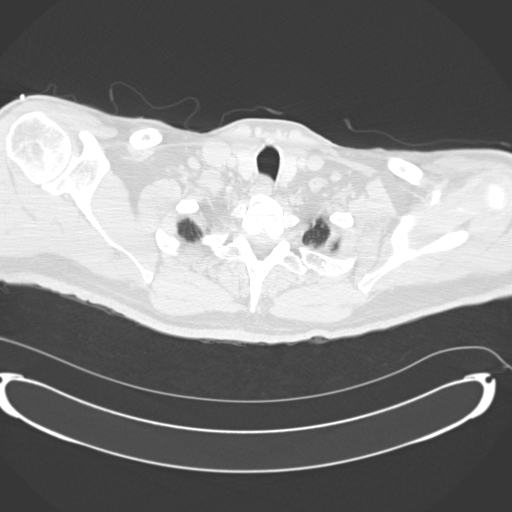
[im 286/302  mediastinal]
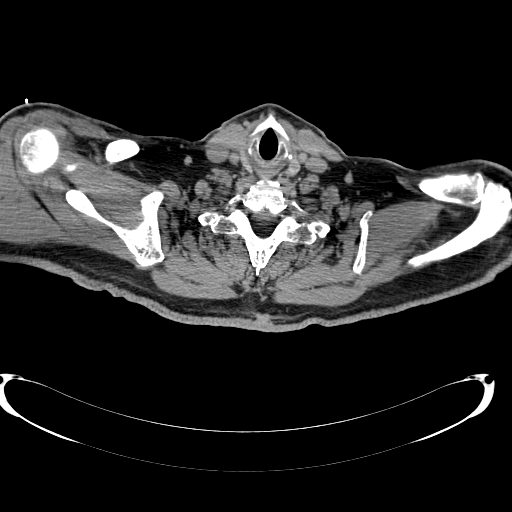

[Series 10: pulm embolism 2.0 spo thins · coronal · 0.79mm/px · 1 of 134 slices shown]
[im 67/134  mediastinal]
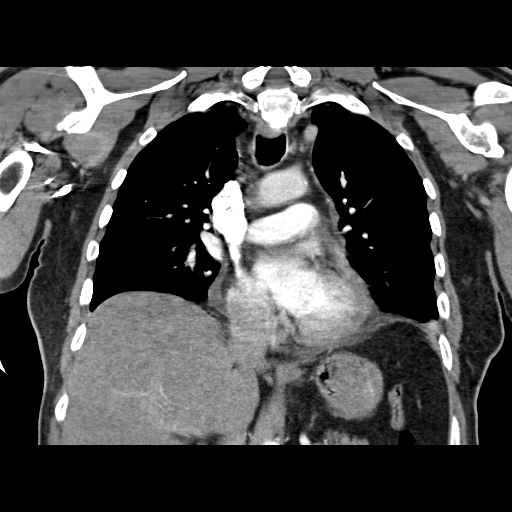

[19 of 36 positions shown; findings below may reference images not displayed]

FINDINGS: Technically adequate study with good opacification of
the central, lobar, and segmental pulmonary arteries.  No filling
defects are demonstrated.  No evidence of pulmonary embolus.
Postoperative changes with median sternotomy and surgical clips in
the mediastinum.  Small pericardial effusion.  Small to moderate
bilateral pleural effusions.  Basilar atelectasis bilaterally.
There is some fluid and infiltration in the anterior mediastinal
fat and the retrosternal region likely due to postoperative change.
No evidence of bone erosion.  Degenerative changes in the spine.
Images of the upper abdomen demonstrate to large simple appearing
cysts in the liver and several smaller cysts.  The largest cyst is
in the posterior segment right lobe and measures about 8.5 cm in
diameter.

Review of the MIP images confirms the above findings.
IMPRESSION: No evidence of pulmonary embolus.  Postoperative changes in the
mediastinum.  Small pericardial effusion.  Bilateral pleural
effusions with basilar atelectasis.  Hepatic cysts.

## 2012-02-27 ENCOUNTER — Encounter: Payer: Self-pay | Admitting: Cardiology

## 2012-02-27 ENCOUNTER — Ambulatory Visit (INDEPENDENT_AMBULATORY_CARE_PROVIDER_SITE_OTHER): Payer: Medicare Other | Admitting: Cardiology

## 2012-02-27 VITALS — BP 118/70 | HR 72 | Wt 197.0 lb

## 2012-02-27 DIAGNOSIS — R079 Chest pain, unspecified: Secondary | ICD-10-CM

## 2012-02-27 DIAGNOSIS — E785 Hyperlipidemia, unspecified: Secondary | ICD-10-CM

## 2012-02-27 DIAGNOSIS — I25810 Atherosclerosis of coronary artery bypass graft(s) without angina pectoris: Secondary | ICD-10-CM

## 2012-02-27 LAB — BASIC METABOLIC PANEL
Calcium: 9.6 mg/dL (ref 8.4–10.5)
Creatinine, Ser: 0.8 mg/dL (ref 0.4–1.5)
GFR: 103.98 mL/min (ref >60.00)
Glucose, Bld: 87 mg/dL (ref 70–99)
Sodium: 137 mEq/L (ref 135–145)

## 2012-02-27 LAB — LIPID PANEL: HDL: 39.1 mg/dL (ref >39.00)

## 2012-02-27 MED ORDER — METOPROLOL SUCCINATE ER 25 MG PO TB24: ORAL_TABLET | ORAL | Status: DC

## 2012-02-27 MED ORDER — ATORVASTATIN CALCIUM 80 MG PO TABS: 80.0000 mg | ORAL_TABLET | Freq: Every day | ORAL | Status: DC

## 2012-02-27 MED ORDER — LISINOPRIL 5 MG PO TABS: 5.0000 mg | ORAL_TABLET | Freq: Every day | ORAL | Status: DC

## 2012-02-27 NOTE — Patient Instructions (Addendum)
Your physician recommends that you continue on your current medications as directed. Please refer to the Current Medication list given to you today.  Your physician recommends that you return for lab work in: today  Your physician wants you to follow-up in: 1 year. You will receive a reminder letter in the mail two months in advance. If you don't receive a letter, please call our office to schedule the follow-up appointment.    

## 2012-02-27 NOTE — Progress Notes (Signed)
Patient ID: Omar Bridges, male   DOB: [birth year], 71 y.o.   MRN: [id number] Dr. Debby Bud  71 yo with history of CAD s/p recent CABG returns for cardiology followup.  Patient presented in 3/12 with chest pain concerning for unstable angina.  Left heart cath showed severe 3 vessel disease with preserved LV ejection fraction.  He had CABG x 5 in 3/12.  At last appointment, he had some fatigue and a feeling of general achiness.  I had him start coenzyme Q10 along with his statin and also cut his metoprolol in half.  He has felt better since then. No chest pain or exertional dyspnea.  He continues to have arthritis pains in his shoulder and knee.   ECG: NSR, LAFB, iRBBB  Labs (4/12): K 3.9, creatinine 0.96, HCT 37.9 Labs (6/12): K 5.1, creatinine 0.9, LFTs, normal, LDL 44, HDL 42 Labs (12/12): K 4.2, creatinine 0.9 Labs (2/13): LDL 51, HDL 40  PMH: 1. CAD: Presented with anginal-type chest pain.  LHC (3/12) with 95% pLAD, 70% large D1, 99% mid CFX, 70% OM2, subtotalled PDA, EF 65%.  Patient had CABG in 3/12 Omar Bridges) with LIMA-LAD, SVG-D, sequential SVG-OM1 and OM2, sequential SVG-prox and mid PDA.   2. Cholelithiasis 3. Traumatic amputation left middle finger 4. Hyperlipidemia 5. H/o pericarditis at age 71  SH: Retired Research officer, trade [employer].  Married, lives in [place].  Nonsmoker, rare ETOH.   FH: Father with CHF, mother died in childbirth.  Current Outpatient Prescriptions  Medication Sig Dispense Refill  . aspirin EC 81 MG tablet Take 2 tablets daily for a total of 162mg  daily      . atorvastatin (LIPITOR) 80 MG tablet Take 1 tablet (80 mg total) by mouth daily.  90 tablet  3  . cholecalciferol (VITAMIN D) 1000 UNITS tablet Take 1,000 Units by mouth daily.       . Coenzyme Q10 200 MG TABS Take 1 daily    0  . fish oil-omega-3 fatty acids 1000 MG capsule Take 1 g by mouth daily.       . fluticasone (FLONASE) 50 MCG/ACT nasal spray Place 2 sprays into the nose daily.  16 g  6  . lisinopril  (PRINIVIL,ZESTRIL) 5 MG tablet Take 1 tablet (5 mg total) by mouth daily.  90 tablet  3  . metoprolol succinate (TOPROL XL) 25 MG 24 hr tablet Take 1/2 tablet daily  45 tablet  3  . nitroGLYCERIN (NITROSTAT) 0.4 MG SL tablet Place 0.4 mg under the tongue. As needed/ as directed for chest pain.         BP 118/70  Pulse 72  Wt 197 lb (89.359 kg) General: NAD Neck: No JVD, no thyromegaly or thyroid nodule.  Lungs: Clear to auscultation bilaterally with normal respiratory effort. CV: Nondisplaced PMI.  Heart regular S1/S2, no S3/S4, no murmur.  No peripheral edema.  No carotid bruit.  Normal pedal pulses.  Abdomen: Soft, nontender, no hepatosplenomegaly, no distention.  Neurologic: Alert and oriented x 3.  Psych: Normal affect. Extremities: No clubbing or cyanosis.   Assessment/Plan:  CAD (coronary artery disease) of bypass graft Doing well with no ischemic symptoms.  Continue ASA 81, statin, lisinopril, Toprol XL.  HYPERLIPIDEMIA Achiness improved with coenzyme Q10.  Continue statin, will check lipids today.   Followup in 1 year if no problems.   Omar Bridges 02/28/2012

## 2012-09-24 ENCOUNTER — Encounter: Payer: Self-pay | Admitting: Internal Medicine

## 2012-09-24 ENCOUNTER — Ambulatory Visit (INDEPENDENT_AMBULATORY_CARE_PROVIDER_SITE_OTHER): Payer: Medicare Other | Admitting: Internal Medicine

## 2012-09-24 VITALS — BP 110/60 | HR 71 | Temp 97.7°F | Resp 12 | Wt 201.0 lb

## 2012-09-24 DIAGNOSIS — R05 Cough: Secondary | ICD-10-CM

## 2012-09-24 NOTE — Progress Notes (Signed)
Subjective:    Patient ID: Omar Bridges, male    DOB: 1941-01-17, 72 y.o.   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  He has a long history of cough but over the past several weeks he has had a wet cough with scant sputum production, no fever or chills, no dypsnea of DOE. Not responding to allegra. No associated systemic symptoms - sweats, weight loss, lymphadenopathy. He has never had a pulmonary evaluation. He denies any h/o GERD.  Past Medical History  Diagnosis Date  . ED (erectile dysfunction)   . Hyperlipidemia   . Cholelithiasis   . Chicken pox   . Allergy   . Heart disease     multi-vessel dz LD, Crc. PDA, OM  . Pericarditis 1978   Past Surgical History  Procedure Laterality Date  . Nasal septum surgery    . Amputation finger / thumb      Traumatic, left middle finger @ age 81  . 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 Coronary artery disease Neg Hx   . Diabetes Neg Hx   . Cancer Neg Hx   . COPD Father   . Heart disease Father    History   Social History  . Marital Status: Married    Spouse Name: N/A    Number of Children: 1  . Years of Education: 13   Occupational History  . embalmer     semi-retired   Social History Main Topics  . Smoking status: Never Smoker   . Smokeless tobacco: Former Neurosurgeon    Types: Chew    Quit date: 01/01/2010  . Alcohol Use: No  . Drug Use: No  . Sexually Active: No   Other Topics Concern  . 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 Married '62 -. 1 son - killed in MVA. 1 dtr - '64. 2 grandchildren. 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 Tries to have a regular exercise program.          Current Outpatient Prescriptions on File Prior to Visit  Medication Sig Dispense Refill  . aspirin EC 81 MG tablet Take 2 tablets  daily for a total of 162mg  daily      . atorvastatin (LIPITOR) 80 MG tablet Take 1 tablet (80 mg total) by mouth daily.  90 tablet  3  . cholecalciferol (VITAMIN D) 1000 UNITS tablet Take 1,000 Units by mouth daily.       . Coenzyme Q10 200 MG TABS Take 1 daily    0  . fish oil-omega-3 fatty acids 1000 MG capsule Take 1 g by mouth daily.       Marland Kitchen lisinopril (PRINIVIL,ZESTRIL) 5 MG tablet Take 1 tablet (5 mg total) by mouth daily.  90 tablet  3  . metoprolol succinate (TOPROL XL) 25 MG 24 hr tablet Take 1/2 tablet daily  45 tablet  3  . nitroGLYCERIN (NITROSTAT) 0.4 MG SL tablet Place 0.4 mg under the tongue. As needed/ as directed for chest pain.       . fluticasone (FLONASE) 50 MCG/ACT nasal spray Place 2 sprays into the nose daily.  16 g  6   No current facility-administered medications on file prior to visit.  Review of Systems System review is negative for any constitutional, cardiac, pulmonary, GI or neuro symptoms or complaints other than as described in the HPI.     Objective:   Physical Exam Filed Vitals:   09/24/12 1127  BP: 110/60  Pulse: 71  Temp: 97.7 F (36.5 C)  Resp: 12   Wt Readings from Last 3 Encounters:  09/24/12 201 lb (91.173 kg)  02/27/12 197 lb (89.359 kg)  09/24/11 192 lb (87.091 kg)   Gen'l- heavyset white man in no distress HEENT - C&S clear Nodes - negative. Cor- 2+ radial RRR Pulm - normal, no rales or wheezes, no increased WOB       Assessment & Plan:  1. Active cough - no sign of bacterial infection, most likely a viral problem. Thus, no indication for antibiotics.  Plan During the day - Robitussin DM or the equivalent  For nighttime - phenergan with codeine  Hydrate, rest  2. Chronic cough - may be a combination allergy. Need to also consider silent acid reflux as source of cough  Plan Daily use of a non-sedating antihistamine, e.g. claritin or allegra         Trial of Dexilant - a type of proton pump inhibitor - this is a long  acting product and if this helps I will prescribe something else.

## 2012-09-24 NOTE — Patient Instructions (Addendum)
Good to see you.  1. Active cough - no sign of bacterial infection, most likely a viral problem. Thus, no indication for antibiotics.  Plan During the day - Robitussin DM or the equivalent  For nighttime - phenergan with codeine  Hydrate, rest  2. Chronic cough - may be a combination allergy. Need to also consider silent acid reflux as source of cough  Plan Daily use of a non-sedating antihistamine, e.g. claritin or allegra  Trial of Dexilant - a type of proton pump inhibitor - this is a long acting product and if this helps I will prescribe something else.  Cough, Adult  A cough is a reflex that helps clear your throat and airways. It can help heal the body or may be a reaction to an irritated airway. A cough may only last 2 or 3 weeks (acute) or may last more than 8 weeks (chronic).  CAUSES Acute cough:  Viral or bacterial infections. Chronic cough:  Infections.  Allergies.  Asthma.  Post-nasal drip.  Smoking.  Heartburn or acid reflux.  Some medicines.  Chronic lung problems (COPD).  Cancer. SYMPTOMS   Cough.  Fever.  Chest pain.  Increased breathing rate.  High-pitched whistling sound when breathing (wheezing).  Colored mucus that you cough up (sputum). TREATMENT   A bacterial cough may be treated with antibiotic medicine.  A viral cough must run its course and will not respond to antibiotics.  Your caregiver may recommend other treatments if you have a chronic cough. HOME CARE INSTRUCTIONS   Only take over-the-counter or prescription medicines for pain, discomfort, or fever as directed by your caregiver. Use cough suppressants only as directed by your caregiver.  Use a cold steam vaporizer or humidifier in your bedroom or home to help loosen secretions.  Sleep in a semi-upright position if your cough is worse at night.  Rest as needed.  Stop smoking if you smoke. SEEK IMMEDIATE MEDICAL CARE IF:   You have pus in your sputum.  Your cough  starts to worsen.  You cannot control your cough with suppressants and are losing sleep.  You begin coughing up blood.  You have difficulty breathing.  You develop pain which is getting worse or is uncontrolled with medicine.  You have a fever. MAKE SURE YOU:   Understand these instructions.  Will watch your condition.  Will get help right away if you are not doing well or get worse. Document Released: 09/08/2010 Document Revised: 06/04/2011 Document Reviewed: 09/08/2010 [facility] Patient Information 2014 [place], [place]. Upper Respiratory Infection, Adult An upper respiratory infection (URI) is also sometimes known as the common cold. The upper respiratory tract includes the nose, sinuses, throat, trachea, and bronchi. Bronchi are the airways leading to the lungs. Most people improve within 1 week, but symptoms can last up to 2 weeks. A residual cough may last even longer.  CAUSES Many different viruses can infect the tissues lining the upper respiratory tract. The tissues become irritated and inflamed and often become very moist. Mucus production is also common. A cold is contagious. You can easily spread the virus to others by oral contact. This includes kissing, sharing a glass, coughing, or sneezing. Touching your mouth or nose and then touching a surface, which is then touched by another person, can also spread the virus. SYMPTOMS  Symptoms typically develop 1 to 3 days after you come in contact with a cold virus. Symptoms vary from person to person. They may include:  Runny nose.  Sneezing.  Nasal  congestion.  Sinus irritation.  Sore throat.  Loss of voice (laryngitis).  Cough.  Fatigue.  Muscle aches.  Loss of appetite.  Headache.  Low-grade fever. DIAGNOSIS  You might diagnose your own cold based on familiar symptoms, since most people get a cold 2 to 3 times a year. Your caregiver can confirm this based on your exam. Most importantly, your caregiver can  check that your symptoms are not due to another disease such as strep throat, sinusitis, pneumonia, asthma, or epiglottitis. Blood tests, throat tests, and X-rays are not necessary to diagnose a common cold, but they may sometimes be helpful in excluding other more serious diseases. Your caregiver will decide if any further tests are required. RISKS AND COMPLICATIONS  You may be at risk for a more severe case of the common cold if you smoke cigarettes, have chronic heart disease (such as heart failure) or lung disease (such as asthma), or if you have a weakened immune system. The very young and very old are also at risk for more serious infections. Bacterial sinusitis, middle ear infections, and bacterial pneumonia can complicate the common cold. The common cold can worsen asthma and chronic obstructive pulmonary disease (COPD). Sometimes, these complications can require emergency medical care and may be life-threatening. PREVENTION  The best way to protect against getting a cold is to practice good hygiene. Avoid oral or hand contact with people with cold symptoms. Wash your hands often if contact occurs. There is no clear evidence that vitamin C, vitamin E, echinacea, or exercise reduces the chance of developing a cold. However, it is always recommended to get plenty of rest and practice good nutrition. TREATMENT  Treatment is directed at relieving symptoms. There is no cure. Antibiotics are not effective, because the infection is caused by a virus, not by bacteria. Treatment may include:  Increased fluid intake. Sports drinks offer valuable electrolytes, sugars, and fluids.  Breathing heated mist or steam (vaporizer or shower).  Eating chicken soup or other clear broths, and maintaining good nutrition.  Getting plenty of rest.  Using gargles or lozenges for comfort.  Controlling fevers with ibuprofen or acetaminophen as directed by your caregiver.  Increasing usage of your inhaler if you have  asthma. Zinc gel and zinc lozenges, taken in the first 24 hours of the common cold, can shorten the duration and lessen the severity of symptoms. Pain medicines may help with fever, muscle aches, and throat pain. A variety of non-prescription medicines are available to treat congestion and runny nose. Your caregiver can make recommendations and may suggest nasal or lung inhalers for other symptoms.  HOME CARE INSTRUCTIONS   Only take over-the-counter or prescription medicines for pain, discomfort, or fever as directed by your caregiver.  Use a warm mist humidifier or inhale steam from a shower to increase air moisture. This may keep secretions moist and make it easier to breathe.  Drink enough water and fluids to keep your urine clear or pale yellow.  Rest as needed.  Return to work when your temperature has returned to normal or as your caregiver advises. You may need to stay home longer to avoid infecting others. You can also use a face mask and careful hand washing to prevent spread of the virus. SEEK MEDICAL CARE IF:   After the first few days, you feel you are getting worse rather than better.  You need your caregiver's advice about medicines to control symptoms.  You develop chills, worsening shortness of breath, or brown or red  sputum. These may be signs of pneumonia.  You develop yellow or brown nasal discharge or pain in the face, especially when you bend forward. These may be signs of sinusitis.  You develop a fever, swollen neck glands, pain with swallowing, or white areas in the back of your throat. These may be signs of strep throat. SEEK IMMEDIATE MEDICAL CARE IF:   You have a fever.  You develop severe or persistent headache, ear pain, sinus pain, or chest pain.  You develop wheezing, a prolonged cough, cough up blood, or have a change in your usual mucus (if you have chronic lung disease).  You develop sore muscles or a stiff neck. Document Released: 09/05/2000  Document Revised: 06/04/2011 Document Reviewed: 07/14/2010 [facility] Patient Information 2014 [place], [place].

## 2012-10-29 ENCOUNTER — Other Ambulatory Visit: Payer: Self-pay

## 2012-11-04 ENCOUNTER — Ambulatory Visit (INDEPENDENT_AMBULATORY_CARE_PROVIDER_SITE_OTHER): Payer: Medicare Other | Admitting: Internal Medicine

## 2012-11-04 ENCOUNTER — Encounter: Payer: Self-pay | Admitting: Internal Medicine

## 2012-11-04 ENCOUNTER — Other Ambulatory Visit (INDEPENDENT_AMBULATORY_CARE_PROVIDER_SITE_OTHER): Payer: Medicare Other

## 2012-11-04 VITALS — BP 130/62 | HR 72 | Temp 97.5°F | Ht 71.5 in | Wt 204.6 lb

## 2012-11-04 DIAGNOSIS — E785 Hyperlipidemia, unspecified: Secondary | ICD-10-CM

## 2012-11-04 DIAGNOSIS — Z0000 Encounter for general adult medical examination without abnormal findings: Secondary | ICD-10-CM

## 2012-11-04 DIAGNOSIS — I25810 Atherosclerosis of coronary artery bypass graft(s) without angina pectoris: Secondary | ICD-10-CM

## 2012-11-04 DIAGNOSIS — Z23 Encounter for immunization: Secondary | ICD-10-CM

## 2012-11-04 LAB — COMPREHENSIVE METABOLIC PANEL
AST: 28 U/L (ref 0–37)
Albumin: 4.5 g/dL (ref 3.5–5.2)
Alkaline Phosphatase: 60 U/L (ref 39–117)
BUN: 19 mg/dL (ref 6–23)
Calcium: 10.2 mg/dL (ref 8.4–10.5)
Chloride: 105 mEq/L (ref 96–112)
Glucose, Bld: 116 mg/dL — ABNORMAL HIGH (ref 70–99)
Potassium: 4.5 mEq/L (ref 3.5–5.1)
Sodium: 140 mEq/L (ref 135–145)
Total Protein: 7.6 g/dL (ref 6.0–8.3)

## 2012-11-04 LAB — HEPATIC FUNCTION PANEL
ALT: 30 U/L (ref 0–53)
AST: 28 U/L (ref 0–37)
Albumin: 4.5 g/dL (ref 3.5–5.2)
Alkaline Phosphatase: 60 U/L (ref 39–117)

## 2012-11-04 LAB — LIPID PANEL
Cholesterol: 120 mg/dL (ref 0–200)
VLDL: 37 mg/dL (ref 0.0–40.0)

## 2012-11-04 MED ORDER — FLUTICASONE PROPIONATE 50 MCG/ACT NA SUSP: 2.0000 | Freq: Every day | NASAL | Status: DC

## 2012-11-04 NOTE — Progress Notes (Signed)
Subjective:    Patient ID: Omar Bridges, male    DOB: 08-06-1940, 72 y.o.   MRN: [id number]  HPI The patient is here for annual Medicare wellness examination and management of other chronic and acute problems.   He is still having hiccups and reports mild dysphagia but no regurgitation with slow transit. He does have some substernal discomfort. He had tried Dexilant which he said did not help his hiccups or substernal discomfort.   The risk factors are reflected in the social history.  The roster of all physicians providing medical care to patient - is listed in the Snapshot section of the chart.  Activities of daily living:  The patient is 100% inedpendent in all ADLs: dressing, toileting, feeding as well as independent mobility  Home safety : The patient has smoke detectors in the home. Falls - none. They wear seatbelts. There is no violence in the home.   There is no risks for hepatitis, STDs or HIV. There is no history of blood transfusion. They have no travel history to infectious disease endemic areas of the world.  The patient has seen their dentist in the last six month. They have seen their eye doctor in the last year. They deny any hearing difficulty as long as he wears his hearing and have not had audiologic testing in the last year.    They do have excessive sun exposure. Discussed the need for sun protection: hats, long sleeves and use of sunscreen if there is significant sun exposure.   Diet: the importance of a healthy diet is discussed. They do have a healthy diet.  The patient has a regular exercise program: exercycle  , 30 min duration, 3-5 per week.  The benefits of regular aerobic exercise were discussed.  Depression screen: there are no signs or vegative symptoms of depression- irritability, change in appetite, anhedonia, sadness/tearfullness.  Cognitive assessment: the patient manages all their financial and personal affairs and is actively engaged.  The  following portions of the patient's history were reviewed and updated as appropriate: allergies, current medications, past family history, past medical history,  past surgical history, past social history  and problem list.  Vision, hearing, body mass index were assessed and reviewed.   During the course of the visit the patient was educated and counseled about appropriate screening and preventive services including : fall prevention , diabetes screening, nutrition counseling, colorectal cancer screening, and recommended immunizations.  Past Medical History  Diagnosis Date  . ED (erectile dysfunction)   . Hyperlipidemia   . Cholelithiasis   . Chicken pox   . Allergy   . Heart disease     multi-vessel dz LD, Crc. PDA, OM  . Pericarditis 1978   Past Surgical History  Procedure Laterality Date  . Nasal septum surgery    . 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 Coronary artery disease Neg Hx   . Diabetes Neg Hx   . Cancer Neg Hx   . COPD Father   . Heart disease Father    History   Social History  . Marital Status: Married    Spouse Name: N/A    Number of Children: 1  . Years of Education: 13   Occupational History  . embalmer  semi-retired   Social History Main Topics  . Smoking status: Never Smoker   . Smokeless tobacco: Former Neurosurgeon    Types: Chew    Quit date: 01/01/2010  . Alcohol Use: No  . Drug Use: No  . Sexually Active: No   Other Topics Concern  . 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 Married '62 -. 1 son - killed in MVA. 1 dtr - '64. 2 grandchildren. 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 Tries to have a regular exercise program.         / Current Outpatient Prescriptions on File Prior to Visit  Medication Sig Dispense Refill   . aspirin EC 81 MG tablet Take 2 tablets daily for a total of 162mg  daily      . atorvastatin (LIPITOR) 80 MG tablet Take 1 tablet (80 mg total) by mouth daily.  90 tablet  3  . cholecalciferol (VITAMIN D) 1000 UNITS tablet Take 1,000 Units by mouth daily.       . Coenzyme Q10 200 MG TABS Take 1 daily    0  . fish oil-omega-3 fatty acids 1000 MG capsule Take 1 g by mouth daily.       Marland Kitchen lisinopril (PRINIVIL,ZESTRIL) 5 MG tablet Take 1 tablet (5 mg total) by mouth daily.  90 tablet  3  . metoprolol succinate (TOPROL XL) 25 MG 24 hr tablet Take 1/2 tablet daily  45 tablet  3  . nitroGLYCERIN (NITROSTAT) 0.4 MG SL tablet Place 0.4 mg under the tongue. As needed/ as directed for chest pain.       . fluticasone (FLONASE) 50 MCG/ACT nasal spray Place 2 sprays into the nose daily.  16 g  6   No current facility-administered medications on file prior to visit.        Review of Systems Constitutional:  Negative for fever, chills, activity change and unexpected weight change.  HEENT:  Negative for hearing loss, ear pain, congestion, neck stiffness and postnasal drip. Negative for sore throat or swallowing problems. Negative for dental complaints.   Eyes: Negative for vision loss or change in visual acuity.  Respiratory: Negative for chest tightness and wheezing. Negative for DOE.   Cardiovascular: Negative for chest pain or palpitations. No decreased exercise tolerance Gastrointestinal: No change in bowel habit. No bloating or gas. No reflux or indigestion Genitourinary: Negative for urgency, frequency, flank pain and difficulty urinating.  Musculoskeletal: Negative for myalgias, back pain, arthralgias and gait problem.  Neurological: Negative for dizziness, tremors, weakness and headaches.  Hematological: Negative for adenopathy.  Psychiatric/Behavioral: Negative for behavioral problems and dysphoric mood.       Objective:   Physical Exam Filed Vitals:   11/04/12 0947  BP: 130/62  Pulse:  72  Temp: 97.5 F (36.4 C)   Wt Readings from Last 3 Encounters:  11/04/12 204 lb 9.6 oz (92.806 kg)  09/24/12 201 lb (91.173 kg)  02/27/12 197 lb (89.359 kg)   Gen'l: Well nourished well developed white male in no acute distress  HEENT: Head: Normocephalic and atraumatic. Right Ear: External ear normal. EAC/TM nl. Left Ear: External ear normal.  EAC/TM nl. Nose: Nose normal. Mouth/Throat: Oropharynx is clear and moist. Dentition - native, in good repair. No buccal or palatal lesions. Posterior pharynx clear. Eyes: Conjunctivae and sclera clear. EOM intact. Pupils are equal, round, and reactive to light. Right eye exhibits no discharge. Left eye exhibits  no discharge. Neck: Normal range of motion. Neck supple. No JVD present. No tracheal deviation present. No thyromegaly present.  Cardiovascular: Normal rate, regular rhythm, no gallop, no friction rub, no murmur heard.      Quiet precordium. 2+ radial and DP pulses . No carotid bruits Pulmonary/Chest: Effort normal. No respiratory distress or increased WOB, no wheezes, no rales. No chest wall deformity or CVAT. Well healed sternotomy scar Abdomen: Soft. Bowel sounds are normal in all quadrants. He exhibits no distension, no tenderness, no rebound or guarding, No heptosplenomegaly  Genitourinary:  deferred Musculoskeletal: Normal range of motion. He exhibits no edema and no tenderness.       Small and large joints without redness, synovial thickening or deformity. Full range of motion preserved about all small, median and large joints.  Lymphadenopathy:    He has no cervical or supraclavicular adenopathy.  Neurological: He is alert and oriented to person, place, and time. CN II-XII intact. DTRs 2+ and symmetrical biceps, radial and patellar tendons. Cerebellar function normal with no tremor, rigidity, normal gait and station.  Skin: Skin is warm and dry. No rash noted. No erythema.  Psychiatric: He has a normal mood and affect. His behavior is  normal. Thought content normal.   Recent Results (from the past 2160 hour(s))  HEPATIC FUNCTION PANEL     Status: None   Collection Time    11/04/12 10:21 AM      Result Value Range   Total Bilirubin 1.0  0.3 - 1.2 mg/dL   Bilirubin, Direct 0.1  0.0 - 0.3 mg/dL   Alkaline Phosphatase 60  39 - 117 U/L   AST 28  0 - 37 U/L   ALT 30  0 - 53 U/L   Total Protein 7.6  6.0 - 8.3 g/dL   Albumin 4.5  3.5 - 5.2 g/dL  LIPID PANEL     Status: Abnormal   Collection Time    11/04/12 10:21 AM      Result Value Range   Cholesterol 120  0 - 200 mg/dL   Comment: ATP III Classification       Desirable:  < 200 mg/dL               Borderline High:  200 - 239 mg/dL          High:  > = 161 mg/dL   Triglycerides 096.0 (*) 0.0 - 149.0 mg/dL   Comment: Normal:  <454 mg/dLBorderline High:  150 - 199 mg/dL   HDL 09.81  >19.14 mg/dL   VLDL 78.2  0.0 - 95.6 mg/dL   LDL Cholesterol 43  0 - 99 mg/dL   Total CHOL/HDL Ratio 3     Comment:                Men          Women1/2 Average Risk     3.4          3.3Average Risk          5.0          4.42X Average Risk          9.6          7.13X Average Risk          15.0          11.0                      COMPREHENSIVE METABOLIC PANEL  Status: Abnormal   Collection Time    11/04/12 10:21 AM      Result Value Range   Sodium 140  135 - 145 mEq/L   Potassium 4.5  3.5 - 5.1 mEq/L   Chloride 105  96 - 112 mEq/L   CO2 28  19 - 32 mEq/L   Glucose, Bld 116 (*) 70 - 99 mg/dL   BUN 19  6 - 23 mg/dL   Creatinine, Ser 0.9  0.4 - 1.5 mg/dL   Total Bilirubin 1.0  0.3 - 1.2 mg/dL   Alkaline Phosphatase 60  39 - 117 U/L   AST 28  0 - 37 U/L   ALT 30  0 - 53 U/L   Total Protein 7.6  6.0 - 8.3 g/dL   Albumin 4.5  3.5 - 5.2 g/dL   Calcium 16.1  8.4 - 09.6 mg/dL   GFR 04.54  >09.81 mL/min          Assessment & Plan:

## 2012-11-04 NOTE — Patient Instructions (Addendum)
Thanks for coming in. You are doing well - normal exam.  Labs - results will be available on MyChart  You are current with health maintenance but need pneumonia vaccine today.,  For the swallow problem and mild substernal discomfort try taking Zantac or Pepcid (they both come as generics)  Come back in 1 year or sooner as needed.

## 2012-11-05 DIAGNOSIS — Z0000 Encounter for general adult medical examination without abnormal findings: Secondary | ICD-10-CM | POA: Insufficient documentation

## 2012-11-05 NOTE — Assessment & Plan Note (Signed)
Lipid panel excellent with LDL much less than 80. LFTs normal.  Plan Continue present regimen.

## 2012-11-05 NOTE — Assessment & Plan Note (Signed)
Interval history is benign. Physical exam is normal. Labs reviewed - normal range. He is current with colorectal cancer screening and has aged out of prostate cancer screening (ACU '13 guidelines). Immunizations are current.  In summary - a nice man who has made a full recovery from CABG and is doing well.

## 2012-11-05 NOTE — Assessment & Plan Note (Signed)
Stable. Follows with cardiology. Risk factor modification: good control of lipids and HTN.

## 2012-11-13 ENCOUNTER — Telehealth: Payer: Self-pay | Admitting: *Deleted

## 2012-11-13 MED ORDER — FLUTICASONE PROPIONATE 50 MCG/ACT NA SUSP: 2.0000 | Freq: Every day | NASAL | Status: DC

## 2012-11-13 NOTE — Telephone Encounter (Signed)
Left msg on triage stating [facility] states they never received flonase. Called pt back inform him will resend to [facility] in [place]...lmb

## 2013-01-29 ENCOUNTER — Other Ambulatory Visit: Payer: Self-pay

## 2013-03-11 ENCOUNTER — Encounter: Payer: Self-pay | Admitting: Cardiology

## 2013-03-11 ENCOUNTER — Ambulatory Visit (INDEPENDENT_AMBULATORY_CARE_PROVIDER_SITE_OTHER): Payer: Medicare Other | Admitting: Cardiology

## 2013-03-11 VITALS — BP 110/70 | HR 72 | Ht 71.5 in | Wt 203.0 lb

## 2013-03-11 DIAGNOSIS — I25810 Atherosclerosis of coronary artery bypass graft(s) without angina pectoris: Secondary | ICD-10-CM

## 2013-03-11 DIAGNOSIS — E785 Hyperlipidemia, unspecified: Secondary | ICD-10-CM

## 2013-03-11 MED ORDER — METOPROLOL SUCCINATE ER 25 MG PO TB24: ORAL_TABLET | ORAL | Status: DC

## 2013-03-11 MED ORDER — NITROGLYCERIN 0.4 MG SL SUBL: 0.4000 mg | SUBLINGUAL_TABLET | SUBLINGUAL | Status: DC | PRN

## 2013-03-11 MED ORDER — LISINOPRIL 5 MG PO TABS: 5.0000 mg | ORAL_TABLET | Freq: Every day | ORAL | Status: DC

## 2013-03-11 MED ORDER — ATORVASTATIN CALCIUM 80 MG PO TABS: 80.0000 mg | ORAL_TABLET | Freq: Every day | ORAL | Status: DC

## 2013-03-11 NOTE — Progress Notes (Signed)
Patient ID: Omar Bridges, male   DOB: 1941-02-17, 72 y.o.   MRN: [id number] Dr. Debby Bud  72 yo with history of CAD s/p recent CABG returns for cardiology followup.  Patient presented in 3/12 with chest pain concerning for unstable angina.  Left heart cath showed severe 3 vessel disease with preserved LV ejection fraction.  He had CABG x 5 in 3/12.  Overall, he has been doing well.  No chest pain or dyspnea. He is active and hunts frequently in the fall.  He is no longer having myalgias (taking coenzyme Q10).    ECG: NSR, right superior axis  Labs (4/12): K 3.9, creatinine 0.96, HCT 37.9 Labs (6/12): K 5.1, creatinine 0.9, LFTs, normal, LDL 44, HDL 42 Labs (12/12): K 4.2, creatinine 0.9 Labs (2/13): LDL 51, HDL 40 Labs (8/14): LDL 43, HDL 40, K 4.5, creatinine 0.9  PMH: 1. CAD: Presented with anginal-type chest pain.  LHC (3/12) with 95% pLAD, 70% large D1, 99% mid CFX, 70% OM2, subtotalled PDA, EF 65%.  Patient had CABG in 3/12 Tyrone Sage) with LIMA-LAD, SVG-D, sequential SVG-OM1 and OM2, sequential SVG-prox and mid PDA.   2. Cholelithiasis 3. Traumatic amputation left middle finger 4. Hyperlipidemia 5. H/o pericarditis at age 63  SH: Retired Research officer, trade [employer].  Married, lives in [place].  Nonsmoker, rare ETOH.   FH: Father with CHF, mother died in childbirth.  Current Outpatient Prescriptions  Medication Sig Dispense Refill  . aspirin EC 81 MG tablet Take 2 tablets daily for a total of 162mg  daily      . atorvastatin (LIPITOR) 80 MG tablet Take 1 tablet (80 mg total) by mouth daily.  90 tablet  3  . cholecalciferol (VITAMIN D) 1000 UNITS tablet Take 1,000 Units by mouth daily.       . Coenzyme Q10 200 MG TABS Take 1 daily    0  . fish oil-omega-3 fatty acids 1000 MG capsule Take 1 g by mouth daily.       Marland Kitchen lisinopril (PRINIVIL,ZESTRIL) 5 MG tablet Take 1 tablet (5 mg total) by mouth daily.  90 tablet  3  . metoprolol succinate (TOPROL XL) 25 MG 24 hr tablet Take 1/2 tablet daily  135 tablet   3  . nitroGLYCERIN (NITROSTAT) 0.4 MG SL tablet Place 1 tablet (0.4 mg total) under the tongue every 5 (five) minutes as needed for chest pain. As needed/ as directed for chest pain.  25 tablet  11   No current facility-administered medications for this visit.    BP 110/70  Pulse 72  Ht 5' 11.5" (1.816 m)  Wt 92.08 kg (203 lb)  BMI 27.92 kg/m2 General: NAD Neck: No JVD, no thyromegaly or thyroid nodule.  Lungs: Clear to auscultation bilaterally with normal respiratory effort. CV: Nondisplaced PMI.  Heart regular S1/S2, no S3/S4, no murmur.  No peripheral edema.  No carotid bruit.  Normal pedal pulses.  Abdomen: Soft, nontender, no hepatosplenomegaly, no distention.  Neurologic: Alert and oriented x 3.  Psych: Normal affect. Extremities: No clubbing or cyanosis.   Assessment/Plan:  CAD (coronary artery disease) of bypass graft Doing well with no ischemic symptoms.  Continue ASA 81, statin, lisinopril, Toprol XL.  HYPERLIPIDEMIA Good lipids in 8/14, continue current statin.   Followup in 1 year if no problems.   Marca Ancona 03/11/2013

## 2013-03-11 NOTE — Patient Instructions (Signed)
Your physician wants you to follow-up in: 1 year with Dr McLean. (December 2015). You will receive a reminder letter in the mail two months in advance. If you don't receive a letter, please call our office to schedule the follow-up appointment.  

## 2014-01-01 ENCOUNTER — Encounter: Payer: Self-pay | Admitting: Cardiology

## 2014-03-08 ENCOUNTER — Encounter: Payer: Self-pay | Admitting: Cardiology

## 2014-03-09 ENCOUNTER — Encounter: Payer: Self-pay | Admitting: *Deleted

## 2014-03-09 ENCOUNTER — Other Ambulatory Visit: Payer: Self-pay | Admitting: *Deleted

## 2014-03-09 DIAGNOSIS — E785 Hyperlipidemia, unspecified: Secondary | ICD-10-CM

## 2014-03-11 ENCOUNTER — Other Ambulatory Visit (INDEPENDENT_AMBULATORY_CARE_PROVIDER_SITE_OTHER): Payer: Medicare Other | Admitting: *Deleted

## 2014-03-11 ENCOUNTER — Ambulatory Visit (INDEPENDENT_AMBULATORY_CARE_PROVIDER_SITE_OTHER): Payer: Medicare Other | Admitting: Cardiology

## 2014-03-11 ENCOUNTER — Encounter: Payer: Self-pay | Admitting: Cardiology

## 2014-03-11 VITALS — BP 110/58 | HR 79 | Ht 71.5 in | Wt 205.8 lb

## 2014-03-11 DIAGNOSIS — E785 Hyperlipidemia, unspecified: Secondary | ICD-10-CM

## 2014-03-11 DIAGNOSIS — I25810 Atherosclerosis of coronary artery bypass graft(s) without angina pectoris: Secondary | ICD-10-CM

## 2014-03-11 LAB — BASIC METABOLIC PANEL
BUN: 17 mg/dL (ref 6–23)
CALCIUM: 10.1 mg/dL (ref 8.4–10.5)
CO2: 27 mEq/L (ref 19–32)
CREATININE: 0.9 mg/dL (ref 0.4–1.5)
Chloride: 103 mEq/L (ref 96–112)
GFR: 93.63 mL/min (ref >60.00)
GLUCOSE: 113 mg/dL — AB (ref 70–99)
Potassium: 4.8 mEq/L (ref 3.5–5.1)
Sodium: 139 mEq/L (ref 135–145)

## 2014-03-11 LAB — CBC WITH DIFFERENTIAL/PLATELET
BASOS ABS: 0.1 10*3/uL (ref 0.0–0.1)
Basophils Relative: 0.5 % (ref 0.0–3.0)
Eosinophils Absolute: 0.4 10*3/uL (ref 0.0–0.7)
Eosinophils Relative: 3.2 % (ref 0.0–5.0)
HCT: 49.1 % (ref 39.0–52.0)
Hemoglobin: 16.4 g/dL (ref 13.0–17.0)
LYMPHS PCT: 18.3 % (ref 12.0–46.0)
Lymphs Abs: 2.1 10*3/uL (ref 0.7–4.0)
MCHC: 33.3 g/dL (ref 30.0–36.0)
MCV: 96.1 fl (ref 78.0–100.0)
MONOS PCT: 6.2 % (ref 3.0–12.0)
Monocytes Absolute: 0.7 10*3/uL (ref 0.1–1.0)
NEUTROS PCT: 71.8 % (ref 43.0–77.0)
Neutro Abs: 8.2 10*3/uL — ABNORMAL HIGH (ref 1.4–7.7)
PLATELETS: 239 10*3/uL (ref 150.0–400.0)
RBC: 5.11 Mil/uL (ref 4.22–5.81)
RDW: 13.2 % (ref 11.5–15.5)
WBC: 11.4 10*3/uL — ABNORMAL HIGH (ref 4.0–10.5)

## 2014-03-11 LAB — HEPATIC FUNCTION PANEL
ALBUMIN: 4.9 g/dL (ref 3.5–5.2)
ALT: 34 U/L (ref 0–53)
AST: 30 U/L (ref 0–37)
Alkaline Phosphatase: 69 U/L (ref 39–117)
Bilirubin, Direct: 0 mg/dL (ref 0.0–0.3)
Total Bilirubin: 1 mg/dL (ref 0.2–1.2)
Total Protein: 7.7 g/dL (ref 6.0–8.3)

## 2014-03-11 LAB — LIPID PANEL
Cholesterol: 127 mg/dL (ref 0–200)
HDL: 37.6 mg/dL — AB (ref >39.00)
LDL Cholesterol: 57 mg/dL (ref 0–99)
NONHDL: 89.4
TRIGLYCERIDES: 161 mg/dL — AB (ref 0.0–149.0)
Total CHOL/HDL Ratio: 3
VLDL: 32.2 mg/dL (ref 0.0–40.0)

## 2014-03-11 MED ORDER — LISINOPRIL 5 MG PO TABS: 5.0000 mg | ORAL_TABLET | Freq: Every day | ORAL | Status: DC

## 2014-03-11 MED ORDER — FLUTICASONE PROPIONATE 50 MCG/ACT NA SUSP: 1.0000 | Freq: Every day | NASAL | Status: DC

## 2014-03-11 MED ORDER — METOPROLOL SUCCINATE ER 25 MG PO TB24: ORAL_TABLET | ORAL | Status: DC

## 2014-03-11 MED ORDER — ATORVASTATIN CALCIUM 80 MG PO TABS: 80.0000 mg | ORAL_TABLET | Freq: Every day | ORAL | Status: DC

## 2014-03-11 NOTE — Patient Instructions (Signed)
Your physician wants you to follow-up in: 1 year with Dr McLean. (December 2016). You will receive a reminder letter in the mail two months in advance. If you don't receive a letter, please call our office to schedule the follow-up appointment.  

## 2014-03-13 NOTE — Progress Notes (Signed)
Patient ID: Omar BentonDonald E Sleight, male   DOB: February 05, 1941, 73 y.o.   MRN: [id number] Dr. Mattie Marlinalph Willett [facility]  73 yo with history of CAD s/p recent CABG returns for cardiology followup.  Patient presented in 3/12 with chest pain concerning for unstable angina.  Left heart cath showed severe 3 vessel disease with preserved LV ejection fraction.  He had CABG x 5 in 3/12.  Overall, he has been doing well.  No chest pain or dyspnea. He is generally active.  Main problem is left knee pain.  He may end up needing left TKR.    ECG: NSR, iRBBB, LAFB  Labs (4/12): K 3.9, creatinine 0.96, HCT 37.9 Labs (6/12): K 5.1, creatinine 0.9, LFTs, normal, LDL 44, HDL 42 Labs (12/12): K 4.2, creatinine 0.9 Labs (2/13): LDL 51, HDL 40 Labs (8/14): LDL 43, HDL 40, K 4.5, creatinine 0.9  PMH: 1. CAD: Presented with anginal-type chest pain.  LHC (3/12) with 95% pLAD, 70% large D1, 99% mid CFX, 70% OM2, subtotalled PDA, EF 65%.  Patient had CABG in 3/12 Tyrone Sage([place]) with LIMA-LAD, SVG-D, sequential SVG-OM1 and OM2, sequential SVG-prox and mid PDA.   2. Cholelithiasis 3. Traumatic amputation left middle finger 4. Hyperlipidemia 5. H/o pericarditis at age 73  SH: Retired Research officer, trade unionmortician.  Married, [place] in [place].  Nonsmoker, rare ETOH.   FH: Father with CHF, mother died in childbirth.  Current Outpatient Prescriptions  Medication Sig Dispense Refill  . aspirin EC 81 MG tablet Take 2 tablets daily for a total of 162mg  daily    . atorvastatin (LIPITOR) 80 MG tablet Take 1 tablet (80 mg total) by mouth daily. 90 tablet 3  . cholecalciferol (VITAMIN D) 1000 UNITS tablet Take 1,000 Units by mouth daily.     . Coenzyme Q10 200 MG TABS Take 1 daily  0  . fish oil-omega-3 fatty acids 1000 MG capsule Take 1 g by mouth daily.     . fluticasone (FLONASE) 50 MCG/ACT nasal spray Place 1 spray into both nostrils daily. 16 g 11  . lisinopril (PRINIVIL,ZESTRIL) 5 MG tablet Take 1 tablet (5 mg total) by mouth daily. 90 tablet 3  .  metoprolol succinate (TOPROL XL) 25 MG 24 hr tablet Take 1/2 tablet daily 45 tablet 3  . nitroGLYCERIN (NITROSTAT) 0.4 MG SL tablet Place 1 tablet (0.4 mg total) under the tongue every 5 (five) minutes as needed for chest pain. As needed/ as directed for chest pain. 25 tablet 11   No current facility-administered medications for this visit.    BP 110/58 mmHg  Pulse 79  Ht 5' 11.5" (1.816 m)  Wt 205 lb 12.8 oz (93.35 kg)  BMI 28.31 kg/m2  SpO2 94% General: NAD Neck: No JVD, no thyromegaly or thyroid nodule.  Lungs: Clear to auscultation bilaterally with normal respiratory effort. CV: Nondisplaced PMI.  Heart regular S1/S2, no S3/S4, no murmur.  No peripheral edema.  No carotid bruit.  Normal pedal pulses.  Abdomen: Soft, nontender, no hepatosplenomegaly, no distention.  Neurologic: Alert and oriented x 3.  Psych: Normal affect. Extremities: No clubbing or cyanosis.   Assessment/Plan:  CAD Status post CABG.  Doing well with no ischemic symptoms.  Continue ASA 81, statin, lisinopril, Toprol XL.  HYPERLIPIDEMIA Check lipids today.   Followup in 1 year if no problems.   Marca AnconaDalton Travanti Mcmanus 03/13/2014

## 2014-04-14 DIAGNOSIS — M7630 Iliotibial band syndrome, unspecified leg: Secondary | ICD-10-CM | POA: Insufficient documentation

## 2014-04-14 DIAGNOSIS — M25569 Pain in unspecified knee: Secondary | ICD-10-CM | POA: Insufficient documentation

## 2014-04-14 DIAGNOSIS — M62459 Contracture of muscle, unspecified thigh: Secondary | ICD-10-CM | POA: Insufficient documentation

## 2014-05-03 DIAGNOSIS — Z5189 Encounter for other specified aftercare: Secondary | ICD-10-CM | POA: Insufficient documentation

## 2014-05-28 ENCOUNTER — Telehealth: Payer: Self-pay

## 2014-05-28 NOTE — Telephone Encounter (Signed)
05/28/14 US/CXR Disc Received from [facility] and [facility]

## 2014-11-03 DIAGNOSIS — L02212 Cutaneous abscess of back [any part, except buttock]: Secondary | ICD-10-CM | POA: Insufficient documentation

## 2015-01-18 ENCOUNTER — Encounter: Payer: Self-pay | Admitting: Cardiology

## 2015-01-18 ENCOUNTER — Encounter: Payer: Self-pay | Admitting: *Deleted

## 2015-01-18 DIAGNOSIS — M25519 Pain in unspecified shoulder: Secondary | ICD-10-CM | POA: Insufficient documentation

## 2015-01-18 DIAGNOSIS — Z01818 Encounter for other preprocedural examination: Secondary | ICD-10-CM | POA: Insufficient documentation

## 2015-01-19 ENCOUNTER — Ambulatory Visit (INDEPENDENT_AMBULATORY_CARE_PROVIDER_SITE_OTHER): Payer: Medicare HMO | Admitting: Nurse Practitioner

## 2015-01-19 ENCOUNTER — Encounter: Payer: Self-pay | Admitting: Nurse Practitioner

## 2015-01-19 VITALS — BP 110/60 | HR 78 | Ht 72.0 in | Wt 198.1 lb

## 2015-01-19 DIAGNOSIS — I25810 Atherosclerosis of coronary artery bypass graft(s) without angina pectoris: Secondary | ICD-10-CM | POA: Diagnosis not present

## 2015-01-19 DIAGNOSIS — E785 Hyperlipidemia, unspecified: Secondary | ICD-10-CM | POA: Diagnosis not present

## 2015-01-19 NOTE — Patient Instructions (Addendum)
We will be checking the following labs today - NONE   Medication Instructions:    Continue with your current medicines.     Testing/Procedures To Be Arranged:  N/A  Follow-Up:   See Dr. Shirlee LatchMcLean as planned.     Other Special Instructions:   I think you are an acceptable candidate for your surgery.     If you need a refill on your cardiac medications before your next appointment, please call your pharmacy.   Call the [facility] office at [phone] if you have any questions, problems or concerns.

## 2015-01-19 NOTE — Progress Notes (Signed)
CARDIOLOGY OFFICE NOTE  Date:  01/19/2015    [name] Date of Birth: [birth year] Medical Record #[id number]  PCP:  Mattie Marlin, MD  Cardiologist:  Shirlee Latch    Chief Complaint  Patient presents with  . Coronary Artery Disease    Surgical clearance - seen for Dr. Shirlee Latch  . Pre-op Exam    History of Present Illness: Omar Bridges is a 74 y.o. male who presents today for a pre op clearance. Seen for Dr. Shirlee Latch.   He has known CAD s/p CABG. Patient presented in 3/12 with chest pain concerning for unstable angina. Left heart cath showed severe 3 vessel disease with preserved LV ejection fraction. He had CABG x 5 in 3/12 by EBG. Did well. Other issues include HLD.   Last seen in December of 2015 - other than arthritis - he was doing well.   Email sent in yesterday - needing clearance for rotator cuff repair with Dr. Leonel Ramsay [facility]. Thus added to my schedule.   Comes back today. Here with his wife. He is doing well. No chest pain. He is pretty active. Tries to exercise regularly. Has not had any chest pain, arm pain, neck pain or shortness of breath. Not dizzy. No syncope. No NTG use. He feels good on his current regimen. He injured his shoulder about 6 weeks ago - has limited mobility and pain at night - wishes to have repaired for maximal benefit and pain relief.  Labs are checked by his PCP.    PMH: 1. CAD: Presented with anginal-type chest pain. LHC (3/12) with 95% pLAD, 70% large D1, 99% mid CFX, 70% OM2, subtotalled PDA, EF 65%. Patient had CABG in 3/12 Tyrone Sage) with LIMA-LAD, SVG-D, sequential SVG-OM1 and OM2, sequential SVG-prox and mid PDA.  2. Cholelithiasis 3. Traumatic amputation left middle finger 4. Hyperlipidemia 5. H/o pericarditis at age 29   Past Medical History  Diagnosis Date  . ED (erectile dysfunction)   . Hyperlipidemia   . Cholelithiasis   . Chicken pox   . Allergy   . Heart disease     multi-vessel dz LD, Crc. PDA, OM  .  Pericarditis 1978    Past Surgical History  Procedure Laterality Date  . Nasal septum surgery    . Amputation finger / thumb      Traumatic, left middle finger @ age 67  . Coronary artery bypass graft  4/12    6 vessel: LIMA- LAD, SVG OM, PDA, Circ     Medications: Current Outpatient Prescriptions  Medication Sig Dispense Refill  . aspirin EC 81 MG tablet Take 2 tablets daily for a total of  daily    . atorvastatin (LIPITOR) 80 MG tablet Take 1 tablet (80 mg total) by mouth daily. 90 tablet 3  . cholecalciferol (VITAMIN D) 1000 UNITS tablet Take 1,000 Units by mouth daily.     . Coenzyme Q10 200 MG TABS Take 1 daily  0  . fish oil-omega-3 fatty acids 1000 MG capsule Take 1 g by mouth daily.     . fluticasone (FLONASE) 50 MCG/ACT nasal spray Place 1 spray into both nostrils daily. 16 g 11  . lisinopril (PRINIVIL,ZESTRIL) 5 MG tablet Take 1 tablet (5 mg total) by mouth daily. 90 tablet 3  . metoprolol succinate (TOPROL XL) 25 MG 24 hr tablet Take 1/2 tablet daily 45 tablet 3  . nitroGLYCERIN (NITROSTAT) 0.4 MG SL tablet Place 1 tablet (0.4 mg total) under the tongue every 5 (  five) minutes as needed for chest pain. As needed/ as directed for chest pain. 25 tablet 11   No current facility-administered medications for this visit.    Allergies: No Known Allergies  Social History: The patient  reports that he has never smoked. He quit smokeless tobacco use about 5 years ago. His smokeless tobacco use included Chew. He reports that he does not drink alcohol or use illicit drugs.   Family History: The patient's family history includes COPD in his father; Congestive Heart Failure (age of onset: 59) in his father; Heart disease in his father; Other (age of onset: 102) in his mother. There is no history of Coronary artery disease, Diabetes, or Cancer.   Review of Systems: Please see the history of present illness.   Otherwise, the review of systems is positive for none.   All other  systems are reviewed and negative.   Physical Exam: VS:  BP 110/60 mmHg  Pulse 78  Ht 6' (1.829 m)  Wt 198 lb 1.9 oz (89.867 kg)  BMI 26.86 kg/m2 .  BMI Body mass index is 26.86 kg/(m^2).  Wt Readings from Last 3 Encounters:  01/19/15 198 lb 1.9 oz (89.867 kg)  03/11/14 205 lb 12.8 oz (93.35 kg)  03/11/13 203 lb (92.08 kg)    General: Pleasant. Well developed, well nourished and in no acute distress.  HEENT: Normal. Neck: Supple, no JVD, carotid bruits, or masses noted.  Cardiac: Regular rate and rhythm. No murmurs, rubs, or gallops. No edema.  Respiratory:  Lungs are clear to auscultation bilaterally with normal work of breathing.  GI: Soft and nontender.  MS: No deformity or atrophy. Gait and ROM intact. Skin: Warm and dry. Color is normal.  Neuro:  Strength and sensation are intact and no gross focal deficits noted.  Psych: Alert, appropriate and with normal affect.   LABORATORY DATA:  EKG:  EKG is ordered today. This demonstrates NSR - unchanged from prior tracings.  Lab Results  Component Value Date   WBC 11.4* 03/11/2014   HGB 16.4 03/11/2014   HCT 49.1 03/11/2014   PLT 239.0 03/11/2014   GLUCOSE 113* 03/11/2014   CHOL 127 03/11/2014   TRIG 161.0* 03/11/2014   HDL 37.60* 03/11/2014   LDLCALC 57 03/11/2014   ALT 34 03/11/2014   AST 30 03/11/2014   NA 139 03/11/2014   K 4.8 03/11/2014   CL 103 03/11/2014   CREATININE 0.9 03/11/2014   BUN 17 03/11/2014   CO2 27 03/11/2014   INR 1.36 06/20/2010   HGBA1C * 06/16/2010    6.0 (NOTE)                                                                       According to the ADA Clinical Practice Recommendations for 2011, when HbA1c is used as a screening test:   >=6.5%   Diagnostic of Diabetes Mellitus           (if abnormal result  is confirmed)  5.7-6.4%   Increased risk of developing Diabetes Mellitus  References:Diagnosis and Classification of Diabetes Mellitus,Diabetes Care,2011,34(Suppl 1):S62-S69 and Standards of  Medical Care in         Diabetes - 2011,Diabetes Care,2011,34  (Suppl 1):S11-S61.    BNP (last  3 results) No results for input(s): BNP in the last 8760 hours.  ProBNP (last 3 results) No results for input(s): PROBNP in the last 8760 hours.   Other Studies Reviewed Today:   Assessment/Plan: 1. CAD with past CABG - he is about 4 1/2 years out from his CABG - he is doing well clinically with no symptoms.   2. HLD - labs followed by PCP  3. Pre op clearance - Needing rotator cuff repair surgery - scheduled for next week. Off aspirin for upcoming surgery. He should be an acceptable candidate for his surgery. He has no cardiac symptoms and remains active.   We will see back as planned.   Current medicines are reviewed with the patient today.  The patient does not have concerns regarding medicines other than what has been noted above.  The following changes have been made:  See above.  Labs/ tests ordered today include:   No orders of the defined types were placed in this encounter.     Disposition:   FU with Dr. Shirlee LatchMcLean in one year.   Patient is agreeable to this plan and will call if any problems develop in the interim.   Signed: Rosalio MacadamiaLori C. Kyel Purk, RN, ANP-C 01/19/2015 2:32 PM  [facility] [address] [place], [place]  [address] Phone: [phone]) [phone] Fax: [phone]) [phone]

## 2015-01-25 HISTORY — PX: ROTATOR CUFF REPAIR: SHX139

## 2015-02-13 ENCOUNTER — Encounter: Payer: Self-pay | Admitting: Cardiology

## 2015-03-17 ENCOUNTER — Other Ambulatory Visit: Payer: Self-pay | Admitting: Cardiology

## 2015-05-06 ENCOUNTER — Ambulatory Visit (INDEPENDENT_AMBULATORY_CARE_PROVIDER_SITE_OTHER): Payer: Medicare HMO | Admitting: Cardiology

## 2015-05-06 ENCOUNTER — Encounter: Payer: Self-pay | Admitting: *Deleted

## 2015-05-06 ENCOUNTER — Encounter: Payer: Self-pay | Admitting: Cardiology

## 2015-05-06 VITALS — BP 132/58 | HR 76 | Ht 72.0 in | Wt 189.0 lb

## 2015-05-06 DIAGNOSIS — I25810 Atherosclerosis of coronary artery bypass graft(s) without angina pectoris: Secondary | ICD-10-CM | POA: Diagnosis not present

## 2015-05-06 DIAGNOSIS — E785 Hyperlipidemia, unspecified: Secondary | ICD-10-CM | POA: Diagnosis not present

## 2015-05-06 MED ORDER — ATORVASTATIN CALCIUM 80 MG PO TABS: 80.0000 mg | ORAL_TABLET | Freq: Every day | ORAL | Status: DC

## 2015-05-06 MED ORDER — LISINOPRIL 5 MG PO TABS: 5.0000 mg | ORAL_TABLET | Freq: Every day | ORAL | Status: DC

## 2015-05-06 MED ORDER — METOPROLOL SUCCINATE ER 25 MG PO TB24: 12.5000 mg | ORAL_TABLET | Freq: Every day | ORAL | Status: DC

## 2015-05-06 NOTE — Patient Instructions (Signed)
Medication Instructions:  Your physician recommends that you continue on your current medications as directed. Please refer to the Current Medication list given to you today.   Labwork: None   Testing/Procedures: None   Follow-Up: Your physician wants you to follow-up in: 1 year with Dr Shirlee Latch. (February 2018). You will receive a reminder letter in the mail two months in advance. If you don't receive a letter, please call our office to schedule the follow-up appointment.       If you need a refill on your cardiac medications before your next appointment, please call your pharmacy.

## 2015-05-08 NOTE — Progress Notes (Signed)
Patient ID: Omar Bridges, male   DOB: Oct 10, 1940, 75 y.o.   MRN: [id number] Dr. Mattie Marlin [facility])  75 yo with history of CAD s/p recent CABG returns for cardiology followup.  Patient presented in 3/12 with chest pain concerning for unstable angina.  Left heart cath showed severe 3 vessel disease with preserved LV ejection fraction.  He had CABG x 5 in 3/12.  No chest pain or dyspnea. He is generally active.  He had rotator cuff surgery in 11/16 with no problems.  He has been diagnosed recently with diabetes and is working on weight loss.  He uses a rowing machine for exercise and works out at [employer].  Labs (4/12): K 3.9, creatinine 0.96, HCT 37.9 Labs (6/12): K 5.1, creatinine 0.9, LFTs, normal, LDL 44, HDL 42 Labs (12/12): K 4.2, creatinine 0.9 Labs (2/13): LDL 51, HDL 40 Labs (8/14): LDL 43, HDL 40, K 4.5, creatinine 0.9 Labs (10/16): K 4.9, creatinine 0.8, hgb 15.5, LDL 58, HDL 48  PMH: 1. CAD: Presented with anginal-type chest pain.  LHC (3/12) with 95% pLAD, 70% large D1, 99% mid CFX, 70% OM2, subtotalled PDA, EF 65%.  Patient had CABG in 3/12 Tyrone Sage) with LIMA-LAD, SVG-D, sequential SVG-OM1 and OM2, sequential SVG-prox and mid PDA.   2. Cholelithiasis 3. Traumatic amputation left middle finger 4. Hyperlipidemia 5. H/o pericarditis at age 12 6. Type 2 diabetes 7. Rotator cuff surgery 11/16.   SH: Retired Research officer, trade union.  Married, lives in [place].  Nonsmoker, rare ETOH.   FH: Father with CHF, mother died in childbirth.  ROS: All systems reviewed and negative except as per HPI.   Current Outpatient Prescriptions  Medication Sig Dispense Refill  . aspirin EC 81 MG tablet Take 2 tablets daily for a total of  daily    . atorvastatin (LIPITOR) 80 MG tablet Take 1 tablet (80 mg total) by mouth daily. 90 tablet 3  . cholecalciferol (VITAMIN D) 1000 UNITS tablet Take 1,000 Units by mouth daily.     . Coenzyme Q10 200 MG TABS Take 1 daily  0  . fish oil-omega-3 fatty acids  1000 MG capsule Take 1 g by mouth daily.     . fluticasone (FLONASE) 50 MCG/ACT nasal spray Place 1 spray into both nostrils daily. 16 g 11  . lisinopril (PRINIVIL,ZESTRIL) 5 MG tablet Take 1 tablet (5 mg total) by mouth daily. 90 tablet 3  . metFORMIN (GLUCOPHAGE) 1000 MG tablet Take 1,000 mg by mouth 2 (two) times daily with a meal.    . metoprolol succinate (TOPROL-XL) 25 MG 24 hr tablet Take 0.5 tablets (12.5 mg total) by mouth daily. 45 tablet 3  . Multiple Vitamins-Minerals (VISION FORMULA/LUTEIN PO) Take 1 tablet by mouth daily.    . nitroGLYCERIN (NITROSTAT) 0.4 MG SL tablet Place 1 tablet (0.4 mg total) under the tongue every 5 (five) minutes as needed for chest pain. As needed/ as directed for chest pain. 25 tablet 11   No current facility-administered medications for this visit.    BP 132/58 mmHg  Pulse 76  Ht 6' (1.829 m)  Wt 189 lb (85.73 kg)  BMI 25.63 kg/m2 General: NAD Neck: No JVD, no thyromegaly or thyroid nodule.  Lungs: Clear to auscultation bilaterally with normal respiratory effort. CV: Nondisplaced PMI.  Heart regular S1/S2, no S3/S4, no murmur.  No peripheral edema.  No carotid bruit.  Normal pedal pulses.  Abdomen: Soft, nontender, no hepatosplenomegaly, no distention.  Neurologic: Alert and oriented x 3.  Psych:  Normal affect. Extremities: No clubbing or cyanosis.   Assessment/Plan:  CAD Status post CABG.  Doing well with no ischemic symptoms.  Continue ASA 81, statin, lisinopril, Toprol XL.  HYPERLIPIDEMIA Good lipids in 10/16.    Followup in 1 year if no problems.   Marca Ancona 05/08/2015

## 2016-05-14 ENCOUNTER — Encounter: Payer: Self-pay | Admitting: Cardiology

## 2016-05-21 NOTE — Progress Notes (Signed)
HPI: FU CAD. Previously followed by Dr Shirlee LatchMclean. Patient presented in 3/12 with chest pain concerning for unstable angina.  Left heart cath showed severe 3 vessel disease with preserved LV ejection fraction.  He had CABG x 5 in 3/12 (LIMA to the LAD, saphenous vein graft to the diagonal, sequential saphenous vein graft to the first and second marginal and sequential saphenous vein graft to the proximal and mid PDA). Carotid Dopplers performed prior to his surgery showed no stenosis. Since last seen, the patient denies any dyspnea on exertion, orthopnea, PND, pedal edema, palpitations, syncope or chest pain.   Current Outpatient Prescriptions  Medication Sig Dispense Refill  . aspirin EC 81 MG tablet Take 2 tablets daily for a total of 162mg  daily    . atorvastatin (LIPITOR) 80 MG tablet Take 1 tablet (80 mg total) by mouth daily. 90 tablet 3  . b complex vitamins tablet Take 1 tablet by mouth daily.    . cholecalciferol (VITAMIN D) 1000 UNITS tablet Take 1,000 Units by mouth daily.     . fish oil-omega-3 fatty acids 1000 MG capsule Take 1 g by mouth daily.     . fluticasone (FLONASE) 50 MCG/ACT nasal spray Place 1 spray into both nostrils daily. 16 g 11  . lisinopril (PRINIVIL,ZESTRIL) 5 MG tablet Take 1 tablet (5 mg total) by mouth daily. 90 tablet 3  . metoprolol succinate (TOPROL-XL) 25 MG 24 hr tablet Take 0.5 tablets (12.5 mg total) by mouth daily. 45 tablet 3  . Multiple Vitamins-Minerals (VISION FORMULA/LUTEIN PO) Take 1 tablet by mouth daily.    . nitroGLYCERIN (NITROSTAT) 0.4 MG SL tablet Place 1 tablet (0.4 mg total) under the tongue every 5 (five) minutes as needed for chest pain. As needed/ as directed for chest pain. 25 tablet 11  . metFORMIN (GLUCOPHAGE) 1000 MG tablet Take 1,000 mg by mouth 2 (two) times daily with a meal.     No current facility-administered medications for this visit.      Past Medical History:  Diagnosis Date  . Allergy   . Chicken pox   .  Cholelithiasis   . ED (erectile dysfunction)   . Heart disease    multi-vessel dz LD, Crc. PDA, OM  . Hyperlipidemia   . Pericarditis 1978    Past Surgical History:  Procedure Laterality Date  . AMPUTATION FINGER / THUMB     Traumatic, left middle finger @ age 89  . CORONARY ARTERY BYPASS GRAFT  4/12   6 vessel: LIMA- LAD, SVG OM, PDA, Circ  . NASAL SEPTUM SURGERY    . ROTATOR CUFF REPAIR Right 01/2015    Social History   Social History  . Marital status: Married    Spouse name: N/A  . Number of children: 1  . Years of education: 2213   Occupational History  . embalmer     semi-retired   Social History Main Topics  . Smoking status: Never Smoker  . Smokeless tobacco: Former NeurosurgeonUser    Types: Chew    Quit date: 01/01/2010  . Alcohol use No  . Drug use: No  . Sexual activity: No   Other Topics Concern  . 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 Married '62 -. 1 son - killed in MVA. 1 dtr - '64. 2 grandchildren. 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 Tries to have a regular exercise program.  Family History  Problem Relation Age of Onset  . Other Mother 17    scarlet fever, died during child birth with complications of scarlet fever  . COPD Father   . Heart disease Father   . Congestive Heart Failure Father 46  . Coronary artery disease Neg Hx   . Diabetes Neg Hx   . Cancer Neg Hx     ROS: no fevers or chills, productive cough, hemoptysis, dysphasia, odynophagia, melena, hematochezia, dysuria, hematuria, rash, seizure activity, orthopnea, PND, pedal edema, claudication. Remaining systems are negative.  Physical Exam: Well-developed well-nourished in no acute distress.  Skin is warm and dry.  HEENT is normal.  Neck is supple.  Chest is clear to auscultation with normal expansion.  Cardiovascular exam is regular rate and rhythm.  Abdominal exam nontender or distended. No masses  palpated. Extremities show no edema. neuro grossly intact  ECG-Sinus rhythm at a rate of 75. Right axis deviation. Personally reviewed.  A/P  1 Coronary artery disease-status post coronary artery bypass and graft. Approximately 20 minutes spent reviewing previous records prior to patient arrival. Patient is doing well with no symptoms. Continue aspirin and statin.   2 Hyperlipidemia-continue statin. We will ask for most recent lipids and liver to be forwarded to [place] from his primary care.  3 diabetes mellitus-management per primary care. He is on an ACE inhibitor and we will have his most recent laboratories including potassium and renal function sent to [place].  Olga Millers, MD

## 2016-05-24 ENCOUNTER — Ambulatory Visit (INDEPENDENT_AMBULATORY_CARE_PROVIDER_SITE_OTHER): Payer: Medicare HMO | Admitting: Cardiology

## 2016-05-24 ENCOUNTER — Encounter: Payer: Self-pay | Admitting: Cardiology

## 2016-05-24 VITALS — BP 106/60 | HR 75 | Ht 72.0 in | Wt 194.0 lb

## 2016-05-24 DIAGNOSIS — I25810 Atherosclerosis of coronary artery bypass graft(s) without angina pectoris: Secondary | ICD-10-CM

## 2016-05-24 DIAGNOSIS — E7800 Pure hypercholesterolemia, unspecified: Secondary | ICD-10-CM

## 2016-05-24 NOTE — Patient Instructions (Signed)
Your physician wants you to follow-up in: ONE YEAR WITH DR CRENSHAW You will receive a reminder letter in the mail two months in advance. If you don't receive a letter, please call our office to schedule the follow-up appointment.   If you need a refill on your cardiac medications before your next appointment, please call your pharmacy.  

## 2016-06-06 ENCOUNTER — Encounter: Payer: Self-pay | Admitting: Cardiology

## 2016-06-06 ENCOUNTER — Other Ambulatory Visit: Payer: Self-pay | Admitting: Cardiology

## 2016-06-06 DIAGNOSIS — I25810 Atherosclerosis of coronary artery bypass graft(s) without angina pectoris: Secondary | ICD-10-CM

## 2016-06-06 DIAGNOSIS — E785 Hyperlipidemia, unspecified: Secondary | ICD-10-CM

## 2016-06-07 ENCOUNTER — Other Ambulatory Visit: Payer: Self-pay | Admitting: *Deleted

## 2016-06-07 DIAGNOSIS — E7800 Pure hypercholesterolemia, unspecified: Secondary | ICD-10-CM

## 2016-06-07 DIAGNOSIS — I25810 Atherosclerosis of coronary artery bypass graft(s) without angina pectoris: Secondary | ICD-10-CM

## 2016-06-07 MED ORDER — ATORVASTATIN CALCIUM 80 MG PO TABS: 80.0000 mg | ORAL_TABLET | Freq: Every day | ORAL | 3 refills | Status: DC

## 2016-06-07 MED ORDER — METOPROLOL SUCCINATE ER 25 MG PO TB24: 12.5000 mg | ORAL_TABLET | Freq: Every day | ORAL | 3 refills | Status: DC

## 2016-06-07 MED ORDER — LISINOPRIL 5 MG PO TABS: 5.0000 mg | ORAL_TABLET | Freq: Every day | ORAL | 3 refills | Status: DC

## 2016-10-18 ENCOUNTER — Encounter: Payer: Self-pay | Admitting: Cardiology

## 2017-04-23 ENCOUNTER — Other Ambulatory Visit: Payer: Self-pay | Admitting: Cardiology

## 2017-04-23 DIAGNOSIS — I25810 Atherosclerosis of coronary artery bypass graft(s) without angina pectoris: Secondary | ICD-10-CM

## 2017-04-23 DIAGNOSIS — E7800 Pure hypercholesterolemia, unspecified: Secondary | ICD-10-CM

## 2017-05-21 NOTE — Progress Notes (Signed)
HPI: FU CAD. Patient presented in 3/12 with chest pain concerning for unstable angina. Left heart cath showed severe 3 vessel disease with preserved LV ejection fraction. He had CABG x 5 in 3/12 (LIMA to the LAD, saphenous vein graft to the diagonal, sequential saphenous vein graft to the first and second marginal and sequential saphenous vein graft to the proximal and mid PDA). Carotid Dopplers performed prior to his surgery showed no stenosis. Since last seen, the patient denies any dyspnea on exertion, orthopnea, PND, pedal edema, palpitations, syncope or chest pain.   Current Outpatient Medications  Medication Sig Dispense Refill  . aspirin EC 81 MG tablet Take 81 mg by mouth daily.     Marland Kitchen. atorvastatin (LIPITOR) 80 MG tablet Take 1 tablet (80 mg total) by mouth daily. 90 tablet 3  . b complex vitamins tablet Take 1 tablet by mouth daily.    . cholecalciferol (VITAMIN D) 1000 UNITS tablet Take 1,000 Units by mouth daily.     . fish oil-omega-3 fatty acids 1000 MG capsule Take 1 g by mouth daily.     . fluticasone (FLONASE) 50 MCG/ACT nasal spray Place 1 spray into both nostrils daily. 16 g 11  . lisinopril (PRINIVIL,ZESTRIL) 5 MG tablet Take 1 tablet (5 mg total) by mouth daily. 90 tablet 3  . metoprolol succinate (TOPROL-XL) 25 MG 24 hr tablet TAKE 1/2 (ONE-HALF) TABLET BY MOUTH ONCE DAILY 45 tablet 3  . Multiple Vitamins-Minerals (VISION FORMULA/LUTEIN PO) Take 1 tablet by mouth daily.    . nitroGLYCERIN (NITROSTAT) 0.4 MG SL tablet Place 1 tablet (0.4 mg total) under the tongue every 5 (five) minutes as needed for chest pain. As needed/ as directed for chest pain. 25 tablet 11  . metFORMIN (GLUCOPHAGE) 1000 MG tablet Take 1,000 mg by mouth 2 (two) times daily with a meal.     No current facility-administered medications for this visit.      Past Medical History:  Diagnosis Date  . Allergy   . Chicken pox   . Cholelithiasis   . ED (erectile dysfunction)   . Heart disease      multi-vessel dz LD, Crc. PDA, OM  . Hyperlipidemia   . Pericarditis 1978    Past Surgical History:  Procedure Laterality Date  . 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 CORONARY ARTERY BYPASS GRAFT  4/12   6 vessel: LIMA- LAD, SVG OM, PDA, Circ  . NASAL SEPTUM SURGERY    . ROTATOR CUFF REPAIR Right 01/2015    Social History   Socioeconomic History  . Marital status: Married    Spouse name: Not on file  . Number of children: 1  . Years of education: 7113  . Highest education level: Not on file  Social Needs  . Financial resource strain: Not on file  . Food insecurity - worry: Not on file  . Food insecurity - inability: Not on file  . Transportation needs - medical: Not on file  . Transportation needs - non-medical: Not on file  Occupational History  . Occupation: embalmer    Comment: semi-retired  Tobacco Use  . Smoking status: Never Smoker  . Smokeless tobacco: Former NeurosurgeonUser    Types: Chew  Substance and Sexual Activity  . Alcohol use: No  . Drug use: No  . Sexual activity: No    Partners: Female  Other Topics Concern  . 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 Married '62 -. 1 son - killed in MVA. 1 dtr - '64. 2 grandchildren. 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 Tries to have a regular exercise program.          Family History  Problem Relation Age of Onset  . Other Mother 17       scarlet fever, died during child birth with complications of scarlet fever  . COPD Father   . Heart disease Father   . Congestive Heart Failure Father 37  . Coronary artery disease Neg Hx   . Diabetes Neg Hx   . Cancer Neg Hx     ROS: Back pain but no fevers or chills, productive cough, hemoptysis, dysphasia, odynophagia, melena, hematochezia, dysuria, hematuria, rash, seizure activity, orthopnea, PND, pedal edema, claudication. Remaining systems are negative.  Physical  Exam: Well-developed well-nourished in no acute distress.  Skin is warm and dry.  HEENT is normal.  Neck is supple.  Chest is clear to auscultation with normal expansion.  Cardiovascular exam is regular rate and rhythm.  Abdominal exam nontender or distended. No masses palpated. Extremities show no edema. neuro grossly intact  ECG-sinus rhythm at a rate of 72.  No ST changes.  Personally reviewed  A/P  1 coronary artery disease-patient is doing well with no chest pain.  Plan to continue medical therapy including aspirin and statin.  2 hyperlipidemia-continue statin.  Lipids and liver monitored by primary care.  Most recent LDL 52.  3 diabetes mellitus-managed by primary care.  Olga Millers, MD

## 2017-05-29 ENCOUNTER — Ambulatory Visit: Payer: Medicare HMO | Admitting: Cardiology

## 2017-05-29 ENCOUNTER — Encounter: Payer: Self-pay | Admitting: Cardiology

## 2017-05-29 VITALS — BP 122/60 | HR 70 | Ht 72.0 in | Wt 195.6 lb

## 2017-05-29 DIAGNOSIS — I25810 Atherosclerosis of coronary artery bypass graft(s) without angina pectoris: Secondary | ICD-10-CM

## 2017-05-29 DIAGNOSIS — E7800 Pure hypercholesterolemia, unspecified: Secondary | ICD-10-CM | POA: Diagnosis not present

## 2017-05-29 NOTE — Patient Instructions (Signed)
Your physician wants you to follow-up in: ONE YEAR WITH DR CRENSHAW You will receive a reminder letter in the mail two months in advance. If you don't receive a letter, please call our office to schedule the follow-up appointment.   If you need a refill on your cardiac medications before your next appointment, please call your pharmacy.  

## 2017-06-04 ENCOUNTER — Telehealth: Payer: Self-pay | Admitting: Cardiology

## 2017-06-04 ENCOUNTER — Other Ambulatory Visit: Payer: Self-pay | Admitting: Cardiology

## 2017-06-04 DIAGNOSIS — E7800 Pure hypercholesterolemia, unspecified: Secondary | ICD-10-CM

## 2017-06-04 DIAGNOSIS — I25810 Atherosclerosis of coronary artery bypass graft(s) without angina pectoris: Secondary | ICD-10-CM

## 2017-06-04 NOTE — Telephone Encounter (Signed)
    [facility] Pre-operative Risk Assessment    Request for surgical clearance:  1. What type of surgery is being performed? Left hip: THA w/wo autograft/allograft   2. When is this surgery scheduled? August 28, 2017   3. What type of clearance is required (medical clearance vs. Pharmacy clearance to hold med vs. Both)? both  4. Are there any medications that need to be held prior to surgery and how long? Aspirin   5. Practice name and name of physician performing surgery? Dr. Rise Mu @ [facility]   6. What is your office phone and fax number? (p) [phone]   (f) [phone] [Omar Bridges]   7. Anesthesia type (None, local, MAC, general) ? Choice    Omar Bridges 06/04/2017, 4:16 PM  _________________________________________________________________   (provider comments below)

## 2017-06-10 NOTE — Telephone Encounter (Signed)
   Primary Cardiologist: Olga MillersBrian Crenshaw, MD  Chart reviewed as part of pre-operative protocol coverage. Patient was contacted 06/10/2017 in reference to pre-operative risk assessment for pending surgery as outlined below.  Omar Bridges was last seen on 05/29/2017 by Dr. Jens Somrenshaw.  Since that day, Omar Bridges has done well and able to walk up 2 flights of stairs or walk 2 blocks from his house without any significant chest pain or shortness of breath.  Therefore, based on ACC/AHA guidelines, the patient would be at acceptable risk for the planned procedure without further cardiovascular testing.   I will route this recommendation to the requesting party via Epic fax function and remove from pre-op pool.  Please call with questions.  Ideally, given his cardiac history, if allowed, he should proceed with surgery on aspirin. However if absolutely needed, he can hold aspirin for 5-7 days prior to surgery and restart as soon as possible afterward.   Omar Bridges, [place] 06/10/2017, 2:48 PM

## 2017-06-25 ENCOUNTER — Other Ambulatory Visit: Payer: Self-pay | Admitting: Cardiology

## 2017-06-25 DIAGNOSIS — I25810 Atherosclerosis of coronary artery bypass graft(s) without angina pectoris: Secondary | ICD-10-CM

## 2017-06-25 DIAGNOSIS — E7800 Pure hypercholesterolemia, unspecified: Secondary | ICD-10-CM

## 2017-06-25 NOTE — Telephone Encounter (Signed)
Rx has been sent to the pharmacy electronically. ° °

## 2017-08-20 ENCOUNTER — Encounter: Payer: Self-pay | Admitting: Cardiology

## 2017-08-20 DIAGNOSIS — I209 Angina pectoris, unspecified: Secondary | ICD-10-CM

## 2017-08-20 HISTORY — DX: Angina pectoris, unspecified: I20.9

## 2017-08-20 NOTE — Patient Instructions (Signed)
Omar Bridges  08/20/2017   Your procedure is scheduled on: Wednesday 08/28/2017  Report to [facility] Main  Entrance              Report to admitting at  1200  PM    Call this number if you have problems the morning of surgery [phone]    Remember: Do not eat food  :After Midnight.  May have clear liquids from midnight up until 0830 am then nothing until after surgery!      CLEAR LIQUID DIET   Foods Allowed                                                                     Foods Excluded  Coffee and tea, regular and decaf                             liquids that you cannot  Plain Jell-O in any flavor                                             see through such as: Fruit ices (not with fruit pulp)                                     milk, soups, orange juice  Iced Popsicles                                    All solid food Carbonated beverages, regular and diet                                    Cranberry, grape and apple juices Sports drinks like Gatorade Lightly seasoned clear broth or consume(fat free) Sugar, honey syrup  Sample Menu Breakfast                                Lunch                                     Supper Cranberry juice                    Beef broth                            Chicken broth Jell-O                                     Grape juice  Apple juice Coffee or tea                        Jell-O                                      Popsicle                                                Coffee or tea                        Coffee or tea  _____________________________________________________________________ How to Manage Your Diabetes Before and After Surgery  Why is it important to control my blood sugar before and after surgery? . Improving blood sugar levels before and after surgery helps healing and can limit problems. . A way of improving blood sugar control is eating a healthy diet  by: o  Eating less sugar and carbohydrates o  Increasing activity/exercise o  Talking with your doctor about reaching your blood sugar goals . High blood sugars (greater than 180 mg/dL) can raise your risk of infections and slow your recovery, so you will need to focus on controlling your diabetes during the weeks before surgery. . Make sure that the doctor who takes care of your diabetes knows about your planned surgery including the date and location.  How do I manage my blood sugar before surgery? . Check your blood sugar at least 4 times a day, starting 2 days before surgery, to make sure that the level is not too high or low. o Check your blood sugar the morning of your surgery when you wake up and every 2 hours until you get to the Short Stay unit. . If your blood sugar is less than 70 mg/dL, you will need to treat for low blood sugar: o Do not take insulin. o Treat a low blood sugar (less than 70 mg/dL) with  cup of clear juice (cranberry or apple), 4 glucose tablets, OR glucose gel. o Recheck blood sugar in 15 minutes after treatment (to make sure it is greater than 70 mg/dL). If your blood sugar is not greater than 70 mg/dL on recheck, call [phone] for further instructions. . Report your blood sugar to the short stay nurse when you get to Short Stay.  . If you are admitted to the hospital after surgery: o Your blood sugar will be checked by the staff and you will probably be given insulin after surgery (instead of oral diabetes medicines) to make sure you have good blood sugar levels. o The goal for blood sugar control after surgery is 80-180 mg/dL.   WHAT DO I DO ABOUT MY DIABETES MEDICATION?        Take the day before surgery , Metformin as usual! . Do not take oral diabetes medicines (pills) the morning of surgery.    Take these medicines the morning of surgery with A SIP OF WATER: Metoprolol Succinate (Toprol-XL)   DO NOT TAKE ANY DIABETIC MEDICATIONS DAY OF YOUR  SURGERY!                               You may not have any  metal on your body including hair pins and              piercings  Do not wear jewelry, make-up, lotions, powders or perfumes, deodorant               Men may shave face and neck.   Do not bring valuables to the hospital. [facility] IS NOT             RESPONSIBLE   FOR VALUABLES.  Contacts, dentures or bridgework may not be worn into surgery.  Leave suitcase in the car. After surgery it may be brought to your room.                  Please read over the following fact sheets you were given: _____________________________________________________________________             [facility] - Preparing for Surgery Before surgery, you can play an important role.  Because skin is not sterile, your skin needs to be as free of germs as possible.  You can reduce the number of germs on your skin by washing with CHG (chlorahexidine gluconate) soap before surgery.  CHG is an antiseptic cleaner which kills germs and bonds with the skin to continue killing germs even after washing. Please DO NOT use if you have an allergy to CHG or antibacterial soaps.  If your skin becomes reddened/irritated stop using the CHG and inform your nurse when you arrive at Short Stay. Do not shave (including legs and underarms) for at least 48 hours prior to the first CHG shower.  You may shave your face/neck. Please follow these instructions carefully:  1.  Shower with CHG Soap the night before surgery and the  morning of Surgery.  2.  If you choose to wash your hair, wash your hair first as usual with your  normal  shampoo.  3.  After you shampoo, rinse your hair and body thoroughly to remove the  shampoo.                           4.  Use CHG as you would any other liquid soap.  You can apply chg directly  to the skin and wash                       Gently with a scrungie or clean washcloth.  5.  Apply the CHG Soap to your body ONLY FROM THE NECK DOWN.   Do not use  on face/ open                           Wound or open sores. Avoid contact with eyes, ears mouth and genitals (private parts).                       Wash face,  Genitals (private parts) with your normal soap.             6.  Wash thoroughly, paying special attention to the area where your surgery  will be performed.  7.  Thoroughly rinse your body with warm water from the neck down.  8.  DO NOT shower/wash with your normal soap after using and rinsing off  the CHG Soap.                9.  Pat yourself dry with a clean towel.  10.  Wear clean pajamas.            11.  Place clean sheets on your bed the night of your first shower and do not  sleep with pets. Day of Surgery : Do not apply any lotions/deodorants the morning of surgery.  Please wear clean clothes to the hospital/surgery center.  FAILURE TO FOLLOW THESE INSTRUCTIONS MAY RESULT IN THE CANCELLATION OF YOUR SURGERY PATIENT SIGNATURE_________________________________  NURSE SIGNATURE__________________________________  ________________________________________________________________________   Omar Bridges  An incentive spirometer is a tool that can help keep your lungs clear and active. This tool measures how well you are filling your lungs with each breath. Taking long deep breaths may help reverse or decrease the chance of developing breathing (pulmonary) problems (especially infection) following:  A long period of time when you are unable to move or be active. BEFORE THE PROCEDURE   If the spirometer includes an indicator to show your best effort, your nurse or respiratory therapist will set it to a desired goal.  If possible, sit up straight or lean slightly forward. Try not to slouch.  Hold the incentive spirometer in an upright position. INSTRUCTIONS FOR USE  1. Sit on the edge of your bed if possible, or sit up as far as you can in bed or on a chair. 2. Hold the incentive spirometer in an upright  position. 3. Breathe out normally. 4. Place the mouthpiece in your mouth and seal your lips tightly around it. 5. Breathe in slowly and as deeply as possible, raising the piston or the ball toward the top of the column. 6. Hold your breath for 3-5 seconds or for as long as possible. Allow the piston or ball to fall to the bottom of the column. 7. Remove the mouthpiece from your mouth and breathe out normally. 8. Rest for a few seconds and repeat Steps 1 through 7 at least 10 times every 1-2 hours when you are awake. Take your time and take a few normal breaths between deep breaths. 9. The spirometer may include an indicator to show your best effort. Use the indicator as a goal to work toward during each repetition. 10. After each set of 10 deep breaths, practice coughing to be sure your lungs are clear. If you have an incision (the cut made at the time of surgery), support your incision when coughing by placing a pillow or rolled up towels firmly against it. Once you are able to get out of bed, walk around indoors and cough well. You may stop using the incentive spirometer when instructed by your caregiver.  RISKS AND COMPLICATIONS  Take your time so you do not get dizzy or light-headed.  If you are in pain, you may need to take or ask for pain medication before doing incentive spirometry. It is harder to take a deep breath if you are having pain. AFTER USE  Rest and breathe slowly and easily.  It can be helpful to keep track of a log of your progress. Your caregiver can provide you with a simple table to help with this. If you are using the spirometer at home, follow these instructions: [place] IF:   You are having difficultly using the spirometer.  You have trouble using the spirometer as often as instructed.  Your pain medication is not giving enough relief while using the spirometer.  You develop fever of 100.5 F (38.1 C) or higher. SEEK IMMEDIATE MEDICAL CARE IF:    You cough up bloody  sputum that had not been present before.  You develop fever of 102 F (38.9 C) or greater.  You develop worsening pain at or near the incision site. MAKE SURE YOU:   Understand these instructions.  Will watch your condition.  Will get help right away if you are not doing well or get worse. Document Released: 07/23/2006 Document Revised: 06/04/2011 Document Reviewed: 09/23/2006 Exit[employer] Patient Information 2014 [employer], [place].   ________________________________________________________________________  WHAT IS A BLOOD TRANSFUSION? Blood Transfusion Information  A transfusion is the replacement of blood or some of its parts. Blood is made up of multiple cells which provide different functions.  Red blood cells carry oxygen and are used for blood loss replacement.  White blood cells fight against infection.  Platelets control bleeding.  Plasma helps clot blood.  Other blood products are available for specialized needs, such as hemophilia or other clotting disorders. BEFORE THE TRANSFUSION  Who gives blood for transfusions?   Healthy volunteers who are fully evaluated to make sure their blood is safe. This is blood bank blood. Transfusion therapy is the safest it has ever been in the practice of medicine. Before blood is taken from a donor, a complete history is taken to make sure that person has no history of diseases nor engages in risky social behavior (examples are intravenous drug use or sexual activity with multiple partners). The donor's travel history is screened to minimize risk of transmitting infections, such as malaria. The donated blood is tested for signs of infectious diseases, such as HIV and hepatitis. The blood is then tested to be sure it is compatible with you in order to minimize the chance of a transfusion reaction. If you or a relative donates blood, this is often done in anticipation of surgery and is not appropriate for emergency  situations. It takes many days to process the donated blood. RISKS AND COMPLICATIONS Although transfusion therapy is very safe and saves many lives, the main dangers of transfusion include:   Getting an infectious disease.  Developing a transfusion reaction. This is an allergic reaction to something in the blood you were given. Every precaution is taken to prevent this. The decision to have a blood transfusion has been considered carefully by your caregiver before blood is given. Blood is not given unless the benefits outweigh the risks. AFTER THE TRANSFUSION  Right after receiving a blood transfusion, you will usually feel much better and more energetic. This is especially true if your red blood cells have gotten low (anemic). The transfusion raises the level of the red blood cells which carry oxygen, and this usually causes an energy increase.  The nurse administering the transfusion will monitor you carefully for complications. HOME CARE INSTRUCTIONS  No special instructions are needed after a transfusion. You may find your energy is better. Speak with your caregiver about any limitations on activity for underlying diseases you may have. SEEK MEDICAL CARE IF:   Your condition is not improving after your transfusion.  You develop redness or irritation at the intravenous (IV) site. SEEK IMMEDIATE MEDICAL CARE IF:  Any of the following symptoms occur over the next 12 hours:  Shaking chills.  You have a temperature by mouth above 102 F (38.9 C), not controlled by medicine.  Chest, back, or muscle pain.  People around you feel you are not acting correctly or are confused.  Shortness of breath or difficulty breathing.  Dizziness and fainting.  You get a rash or develop hives.  You have a decrease  in urine output.  Your urine turns a dark color or changes to pink, red, or brown. Any of the following symptoms occur over the next 10 days:  You have a temperature by mouth above  102 F (38.9 C), not controlled by medicine.  Shortness of breath.  Weakness after normal activity.  The white part of the eye turns yellow (jaundice).  You have a decrease in the amount of urine or are urinating less often.  Your urine turns a dark color or changes to pink, red, or brown. Document Released: 03/09/2000 Document Revised: 06/04/2011 Document Reviewed: 10/27/2007 [facility] Patient Information 2014 [place], [place].  _______________________________________________________________________

## 2017-08-20 NOTE — Progress Notes (Signed)
06/04/2017- Medical Clearance from Dr. Beckey Downing on chart.

## 2017-08-21 ENCOUNTER — Inpatient Hospital Stay (HOSPITAL_COMMUNITY)
Admission: RE | Admit: 2017-08-21 | Discharge: 2017-08-21 | Disposition: A | Discharge status: Home / Self Care | Payer: Medicare HMO | Source: Ambulatory Visit

## 2017-08-21 ENCOUNTER — Telehealth: Payer: Self-pay | Admitting: *Deleted

## 2017-08-21 MED ORDER — METFORMIN HCL 500 MG PO TABS
1000.00 | ORAL_TABLET | ORAL | Status: DC
Start: 2017-08-21 — End: 2017-08-21

## 2017-08-21 MED ORDER — ASPIRIN EC 81 MG PO TBEC
81.00 | DELAYED_RELEASE_TABLET | ORAL | Status: DC
Start: 2017-08-21 — End: 2017-08-21

## 2017-08-21 MED ORDER — SODIUM CHLORIDE 0.9 % IV SOLN
10.00 | INTRAVENOUS | Status: DC
Start: ? — End: 2017-08-21

## 2017-08-21 MED ORDER — METOPROLOL TARTRATE 25 MG PO TABS
25.00 | ORAL_TABLET | ORAL | Status: DC
Start: 2017-08-21 — End: 2017-08-21

## 2017-08-21 MED ORDER — GENERIC EXTERNAL MEDICATION
Status: DC
Start: ? — End: 2017-08-21

## 2017-08-21 MED ORDER — NITROGLYCERIN 0.4 MG SL SUBL
.40 | SUBLINGUAL_TABLET | SUBLINGUAL | Status: DC
Start: ? — End: 2017-08-21

## 2017-08-21 MED ORDER — ATORVASTATIN CALCIUM 80 MG PO TABS
80.00 | ORAL_TABLET | ORAL | Status: DC
Start: 2017-08-21 — End: 2017-08-21

## 2017-08-21 MED ORDER — SENNA-DOCUSATE SODIUM 8.6-50 MG PO TABS
1.00 | ORAL_TABLET | ORAL | Status: DC
Start: ? — End: 2017-08-21

## 2017-08-21 MED ORDER — ENOXAPARIN SODIUM 40 MG/0.4ML ~~LOC~~ SOLN
40.00 | SUBCUTANEOUS | Status: DC
Start: 2017-08-21 — End: 2017-08-21

## 2017-08-21 MED ORDER — MORPHINE SULFATE (PF) 2 MG/ML IV SOLN
2.00 | INTRAVENOUS | Status: DC
Start: ? — End: 2017-08-21

## 2017-08-21 NOTE — Telephone Encounter (Signed)
Spoke with patient's wife r/e mychart message and she request that Dr. Jens Som is informed about patient's admission to [facility] for chest pain and is scheduled for stress test this morning. Wife said she has already left a message with [facility] to reschedule pre-admit appointment for this morning. Message sent to [facility] as FYI.

## 2017-08-26 ENCOUNTER — Encounter: Payer: Self-pay | Admitting: Physician Assistant

## 2017-08-26 ENCOUNTER — Encounter (HOSPITAL_COMMUNITY)
Admission: RE | Admit: 2017-08-26 | Discharge: 2017-08-26 | Disposition: A | Discharge status: Home / Self Care | Payer: Medicare HMO | Source: Ambulatory Visit | Attending: Orthopedic Surgery | Admitting: Orthopedic Surgery

## 2017-08-26 ENCOUNTER — Other Ambulatory Visit: Payer: Self-pay

## 2017-08-26 ENCOUNTER — Ambulatory Visit: Payer: Medicare HMO | Admitting: Physician Assistant

## 2017-08-26 ENCOUNTER — Encounter (HOSPITAL_COMMUNITY): Payer: Self-pay

## 2017-08-26 VITALS — BP 128/68 | HR 82 | Ht 72.0 in | Wt 194.4 lb

## 2017-08-26 DIAGNOSIS — Z01810 Encounter for preprocedural cardiovascular examination: Secondary | ICD-10-CM

## 2017-08-26 DIAGNOSIS — E785 Hyperlipidemia, unspecified: Secondary | ICD-10-CM | POA: Diagnosis not present

## 2017-08-26 DIAGNOSIS — E119 Type 2 diabetes mellitus without complications: Secondary | ICD-10-CM

## 2017-08-26 DIAGNOSIS — I25810 Atherosclerosis of coronary artery bypass graft(s) without angina pectoris: Secondary | ICD-10-CM

## 2017-08-26 HISTORY — DX: Angina pectoris, unspecified: I20.9

## 2017-08-26 HISTORY — DX: Unspecified osteoarthritis, unspecified site: M19.90

## 2017-08-26 HISTORY — DX: Type 2 diabetes mellitus without complications: E11.9

## 2017-08-26 HISTORY — DX: Atherosclerotic heart disease of native coronary artery without angina pectoris: I25.10

## 2017-08-26 LAB — URINALYSIS, ROUTINE W REFLEX MICROSCOPIC
Bilirubin Urine: NEGATIVE
Glucose, UA: 50 mg/dL — AB
Hgb urine dipstick: NEGATIVE
Ketones, ur: 5 mg/dL — AB
Leukocytes, UA: NEGATIVE
Nitrite: NEGATIVE
Protein, ur: NEGATIVE mg/dL
Specific Gravity, Urine: 1.016 (ref 1.005–1.030)
pH: 5 (ref 5.0–8.0)

## 2017-08-26 LAB — ABO/RH: ABO/RH(D): O POS

## 2017-08-26 LAB — PROTIME-INR
INR: 0.97
Prothrombin Time: 12.8 seconds (ref 11.4–15.2)

## 2017-08-26 LAB — GLUCOSE, CAPILLARY: GLUCOSE-CAPILLARY: 171 mg/dL — AB (ref 65–99)

## 2017-08-26 LAB — APTT: aPTT: 33 seconds (ref 24–36)

## 2017-08-26 LAB — SURGICAL PCR SCREEN
MRSA, PCR: NEGATIVE
STAPHYLOCOCCUS AUREUS: NEGATIVE

## 2017-08-26 NOTE — Progress Notes (Signed)
Cardiology Office Note    Date:  08/26/2017   ID:  ANSEN SAYEGH, DOB 08/22/1940, MRN [id number]  PCP:  Mattie Marlin, MD  Cardiologist:  Dr. Jens Som  Chief Complaint  Patient presents with  . Follow-up    seen for Dr. Jens Som  . Pre-op Exam    left total hip by Dr. Lequita Halt    History of Present Illness:  Omar Bridges is a 77 y.o. male with PMH of CAD, DM 2, hyperlipidemia and a history of pericarditis.  Cardiac catheterization in 2012 showed three-vessel disease with preserved LV function.  He eventually underwent CABG x5 in March 2012 with LIMA to LAD, SVG to D1, sequential SVG to OM1 and OM 2, sequential SVG to proximal and mid PDA.  Carotid Doppler prior to the surgery showed no significant stenosis.  Patient was last seen by Dr. Jens Som on 05/29/2017, he was doing well at the time  Patient was recently cleared for orthopedic surgery, however ended up admitted to [facility] for chest pain.  He described the chest discomfort as a burning sensation that is different from his previous anginal symptom.  Vitals were normal at the time.  Troponin were normal.  Hemoglobin A1c was 6.1.  He underwent stress test which came back negative as well.  He was released to follow-up with cardiology as outpatient.  Echocardiogram performed during this admission showed EF 50 to 55%, mild MR, mild to moderate TR.  Myoview performed on the following day showed EF 66%, no myocardial perfusion defect was seen during stress or resting image.  Since his hospital discharge, he has not had any further episode.  He had a total of 45 minutes episode of chest pain prior to arriving to the hospital, he has not had any further symptoms since.  Given reassuring stress test and echocardiogram and the lack of recurrence, he is cleared to proceed with hip surgery by Dr. Lequita Halt.    Past Medical History:  Diagnosis Date  . Allergy   . Anginal pain (HCC) 08/20/2017   went to [facility] and  had work uo- records on chart  . Arthritis   . Chicken pox   . Cholelithiasis   . Coronary artery disease   . Diabetes mellitus without complication (HCC)   . ED (erectile dysfunction)   . Heart disease    multi-vessel dz LD, Crc. PDA, OM  . Hyperlipidemia   . Pericarditis 1978    Past Surgical History:  Procedure Laterality Date  . 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 CORONARY ARTERY BYPASS GRAFT  4/12   6 vessel: LIMA- LAD, SVG OM, PDA, Circ  . EYE SURGERY     bilateral cataract surgery with lens implants  . NASAL SEPTUM SURGERY    . ROTATOR CUFF REPAIR Right 01/2015    Current Medications: Outpatient Medications Prior to Visit  Medication Sig Dispense Refill  . ASPERCREME LIDOCAINE EX Apply 1 application topically 4 (four) times daily as needed (FOR PAIN).    Marland Kitchen aspirin EC 81 MG tablet Take 81 mg by mouth daily.     Marland Kitchen atorvastatin (LIPITOR) 80 MG tablet TAKE 1 TABLET BY MOUTH ONCE DAILY (Patient taking differently: TAKE 1 TABLET BY MOUTH ONCE DAILY WITH SUPPER) 90 tablet 3  . Cholecalciferol (VITAMIN D3) 2000 units TABS Take 2,000 units of lipase by mouth daily.    . diphenhydramine-acetaminophen (TYLENOL PM) 25-500 MG TABS tablet Take 2  tablets by mouth at bedtime as needed.    . fish oil-omega-3 fatty acids 1000 MG capsule Take 1 g by mouth every evening.     . fluticasone (FLONASE) 50 MCG/ACT nasal spray Place 1 spray into both nostrils daily. (Patient taking differently: Place 1 spray into both nostrils daily as needed for allergies. ) 16 g 11  . lisinopril (PRINIVIL,ZESTRIL) 5 MG tablet TAKE 1 TABLET BY MOUTH ONCE DAILY 90 tablet 3  . metFORMIN (GLUCOPHAGE) 1000 MG tablet Take 1,000 mg by mouth 2 (two) times daily with a meal.    . metoprolol succinate (TOPROL-XL) 25 MG 24 hr tablet TAKE 1/2 (ONE-HALF) TABLET BY MOUTH ONCE DAILY 45 tablet 3  . Multiple Vitamins-Minerals (OCUVITE-LUTEIN PO) Take 1 tablet by mouth daily.    . nitroGLYCERIN  (NITROSTAT) 0.4 MG SL tablet Place 1 tablet (0.4 mg total) under the tongue every 5 (five) minutes as needed for chest pain. As needed/ as directed for chest pain. 25 tablet 11  . vitamin B-12 (CYANOCOBALAMIN) 500 MCG tablet Take 500 mcg by mouth daily.     No facility-administered medications prior to visit.      Allergies:   Sulfa antibiotics   Social History   Socioeconomic History  . Marital status: Married    Spouse name: Not on file  . Number of children: 1  . Years of education: 38  . Highest education level: Not on file  Occupational History  . Occupation: embalmer    Comment: semi-retired  Social Needs  . Financial resource strain: Not on file  . Food insecurity:    Worry: Not on file    Inability: Not on file  . Transportation needs:    Medical: Not on file    Non-medical: Not on file  Tobacco Use  . Smoking status: Never Smoker  . Smokeless tobacco: Former Neurosurgeon    Types: Chew  Substance and Sexual Activity  . Alcohol use: No  . Drug use: No  . Sexual activity: Never    Partners: Female  Lifestyle  . Physical activity:    Days per week: Not on file    Minutes per session: Not on file  . Stress: Not on file  Relationships  . Social connections:    Talks on phone: Not on file    Gets together: Not on file    Attends religious service: Not on file    Active member of club or organization: Not on file    Attends meetings of clubs or organizations: Not on file    Relationship status: Not on file  Other Topics Concern  . 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 Married '62 -. 1 son - killed in MVA. 1 dtr - '64. 2 grandchildren. 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 Tries to have a regular exercise program.           Family History:  The patient's family history includes COPD in his father; Congestive Heart Failure (age of onset: 37) in his father; Heart disease in his father; Other (age of onset: 59)  in his mother.   ROS:   Please see the history of present illness.    ROS All other systems reviewed and are negative.   PHYSICAL EXAM:   VS:  BP 128/68   Pulse 82   Ht 6' (1.829 m)   Wt 194 lb 6.4 oz (88.2 kg)  BMI 26.37 kg/m    GEN: Well nourished, well developed, in no acute distress  HEENT: normal  Neck: no JVD, carotid bruits, or masses Cardiac: RRR; no murmurs, rubs, or gallops,no edema  Respiratory:  clear to auscultation bilaterally, normal work of breathing GI: soft, nontender, nondistended, + BS MS: no deformity or atrophy  Skin: warm and dry, no rash Neuro:  Alert and Oriented x 3, Strength and sensation are intact Psych: euthymic mood, full affect  Wt Readings from Last 3 Encounters:  08/26/17 194 lb 6.4 oz (88.2 kg)  08/26/17 195 lb 6.4 oz (88.6 kg)  05/29/17 195 lb 9.6 oz (88.7 kg)      Studies/Labs Reviewed:   EKG:  EKG is not ordered today.   Recent Labs: No results found for requested labs within last 8760 hours.   Lipid Panel    Component Value Date/Time   CHOL 127 03/11/2014 1211   TRIG 161.0 (H) 03/11/2014 1211   HDL 37.60 (L) 03/11/2014 1211   CHOLHDL 3 03/11/2014 1211   VLDL 32.2 03/11/2014 1211   LDLCALC 57 03/11/2014 1211    Additional studies/ records that were reviewed today include:   Echo 08/21/2017 Interpretation Summary The study was technically difficult. Ejection Fraction = 50-55%. The transmitral spectral Doppler flow pattern is suggestive of impaired LV relaxation. There is mild mitral regurgitation. There is mild to moderate tricuspid regurgitation.   Myoview 08/22/2017 There is no significant abnormality of the left ventricular wall motion or wall thickness noted with post-stress resting left ventricular ejection fraction of 66%. Thereare no myocardial perfusion defects seen during stress or resting images.  CONCLUSIONS: This study is negative for myocardial ischemia with normal LVEF.   ASSESSMENT:    1.  Preop cardiovascular exam   2. Coronary artery disease involving coronary bypass graft of native heart without angina pectoris   3. Controlled type 2 diabetes mellitus without complication, without long-term current use of insulin (HCC)   4. Hyperlipidemia, unspecified hyperlipidemia type      PLAN:  In order of problems listed above:  1. Preoperative clearance prior to left hip surgery by Dr. Lequita HaltAluisio: Patient is cleared to proceed with surgery without further work-up.  His recent echocardiogram and stress test are quite reassuring and his symptom is very atypical and there has been no recurrence of chest discomfort since his hospital release  2. CAD s/p CABG: On aspirin and Lipitor  3. DM 2: Managed by primary care provider  4. Hyperlipidemia: On Lipitor 80 mg daily, will defer annual lipid panel to primary care provider    Medication Adjustments/Labs and Tests Ordered: Current medicines are reviewed at length with the patient today.  Concerns regarding medicines are outlined above.  Medication changes, Labs and Tests ordered today are listed in the Patient Instructions below. Patient Instructions  Medication Instructions:  Your physician recommends that you continue on your current medications as directed. Please refer to the Current Medication list given to you today.  Labwork: none  Testing/Procedures: none  Follow-Up: Your physician wants you to follow-up in: 9 months with Dr Shelda Palrenshaw  You will receive a reminder letter in the mail two months in advance. If you don't receive a letter, please call our office to schedule the follow-up appointment.  Any Other Special Instructions Will Be Listed Below (If Applicable).  YOU ARE CLEARED FROM A CARDIAC STANDPOINT FOR YOUR UPCOMING SURGERY WITH DR Lequita HaltALUISIO   If you need a refill on your cardiac medications before your next appointment, please  call your pharmacy.     Ramond Dial, [place]  08/26/2017 1:09 PM    [facility] [address] [place], [place], [place]  [address] Phone: [phone]; Fax: [phone]

## 2017-08-26 NOTE — Progress Notes (Signed)
   08/26/17 0924  OBSTRUCTIVE SLEEP APNEA  Have you ever been diagnosed with sleep apnea through a sleep study? No  Do you snore loudly (loud enough to be heard through closed doors)?  1  Do you often feel tired, fatigued, or sleepy during the daytime (such as falling asleep during driving or talking to someone)? 1  Has anyone observed you stop breathing during your sleep? 0  Do you have, or are you being treated for high blood pressure? 0  BMI more than 35 kg/m2? 0  Age > 50 (1-yes) 1  Neck circumference greater than:Male 16 inches or larger, Male 17inches or larger? 1  Male Gender (Yes=1) 1  Obstructive Sleep Apnea Score 5  Score 5 or greater  Results sent to PCP

## 2017-08-26 NOTE — Patient Instructions (Addendum)
Medication Instructions:  Your physician recommends that you continue on your current medications as directed. Please refer to the Current Medication list given to you today.  Labwork: none  Testing/Procedures: none  Follow-Up: Your physician wants you to follow-up in: 9 months with Dr Shelda Palrenshaw  You will receive a reminder letter in the mail two months in advance. If you don't receive a letter, please call our office to schedule the follow-up appointment.  Any Other Special Instructions Will Be Listed Below (If Applicable).  YOU ARE CLEARED FROM A CARDIAC STANDPOINT FOR YOUR UPCOMING SURGERY WITH DR Lequita HaltALUISIO   If you need a refill on your cardiac medications before your next appointment, please call your pharmacy.

## 2017-08-26 NOTE — Progress Notes (Signed)
08/20/2017- Stress test, Pa and Lateral Chest xray, [facility] chart- HgA1c, Troponin, CMP, Magnesium, CBC w/diff.

## 2017-08-26 NOTE — Patient Instructions (Signed)
Omar BentonDonald E Bridges  08/26/2017   Your procedure is scheduled on: Wednesday 08/28/2017  [facility]              Report to admitting at  1200 NOON    Call this number if you have problems the morning of surgery [phone]    Remember: Do not eat food  :After Midnight.  May have clear liquids from midnight up until 0830 am THEN NOTHING UNTIL AFTER SURGERY!     CLEAR LIQUID DIET   Foods Allowed                                                                     Foods Excluded  Coffee and tea, regular and decaf                             liquids that you cannot  Plain Jell-O in any flavor                                             see through such as: Fruit ices (not with fruit pulp)                                     milk, soups, orange juice  Iced Popsicles                                    All solid food Carbonated beverages, regular and diet                                    Cranberry, grape and apple juices Sports drinks like Gatorade Lightly seasoned clear broth or consume(fat free) Sugar, honey syrup  Sample Menu Breakfast                                Lunch                                     Supper Cranberry juice                    Beef broth                            Chicken broth Jell-O                                     Grape juice  Apple juice Coffee or tea                        Jell-O                                      Popsicle                                                Coffee or tea                        Coffee or tea  _____________________________________________________________________  How to Manage Your Diabetes Before and After Surgery  Why is it important to control my blood sugar before and after surgery? . Improving blood sugar levels before and after surgery helps healing and can limit problems. . A way of improving blood sugar control is eating a healthy diet  by: o  Eating less sugar and carbohydrates o  Increasing activity/exercise o  Talking with your doctor about reaching your blood sugar goals . High blood sugars (greater than 180 mg/dL) can raise your risk of infections and slow your recovery, so you will need to focus on controlling your diabetes during the weeks before surgery. . Make sure that the doctor who takes care of your diabetes knows about your planned surgery including the date and location.  How do I manage my blood sugar before surgery? . Check your blood sugar at least 4 times a day, starting 2 days before surgery, to make sure that the level is not too high or low. o Check your blood sugar the morning of your surgery when you wake up and every 2 hours until you get to the Short Stay unit. . If your blood sugar is less than 70 mg/dL, you will need to treat for low blood sugar: o Do not take insulin. o Treat a low blood sugar (less than 70 mg/dL) with  cup of clear juice (cranberry or apple), 4 glucose tablets, OR glucose gel. o Recheck blood sugar in 15 minutes after treatment (to make sure it is greater than 70 mg/dL). If your blood sugar is not greater than 70 mg/dL on recheck, call [phone] for further instructions. . Report your blood sugar to the short stay nurse when you get to Short Stay.  . If you are admitted to the hospital after surgery: o Your blood sugar will be checked by the staff and you will probably be given insulin after surgery (instead of oral diabetes medicines) to make sure you have good blood sugar levels. o The goal for blood sugar control after surgery is 80-180 mg/dL.   WHAT DO I DO ABOUT MY DIABETES MEDICATION?         The daqy before surgery, take Metformin (Glucophage)as ususal  . Do not take oral diabetes medicines (pills) the morning of surgery.       Take these medicines the morning of surgery with A SIP OF WATER: Metoprolol Succinate ( Toprol-XL)              DO NOT TAKE ANY  DIABETIC MEDICATIONS DAY OF YOUR SURGERY!  You may not have any metal on your body including hair pins and              piercings  Do not wear jewelry, make-up, lotions, powders or perfumes, deodorant                          Men may shave face and neck.   Do not bring valuables to the hospital. [facility] IS NOT             RESPONSIBLE   FOR VALUABLES.  Contacts, dentures or bridgework may not be worn into surgery.  Leave suitcase in the car. After surgery it may be brought to your room.                Please read over the following fact sheets you were given: _____________________________________________________________________             [facility] - Preparing for Surgery Before surgery, you can play an important role.  Because skin is not sterile, your skin needs to be as free of germs as possible.  You can reduce the number of germs on your skin by washing with CHG (chlorahexidine gluconate) soap before surgery.  CHG is an antiseptic cleaner which kills germs and bonds with the skin to continue killing germs even after washing. Please DO NOT use if you have an allergy to CHG or antibacterial soaps.  If your skin becomes reddened/irritated stop using the CHG and inform your nurse when you arrive at Short Stay. Do not shave (including legs and underarms) for at least 48 hours prior to the first CHG shower.  You may shave your face/neck. Please follow these instructions carefully:  1.  Shower with CHG Soap the night before surgery and the  morning of Surgery.  2.  If you choose to wash your hair, wash your hair first as usual with your  normal  shampoo.  3.  After you shampoo, rinse your hair and body thoroughly to remove the  shampoo.                           4.  Use CHG as you would any other liquid soap.  You can apply chg directly  to the skin and wash                       Gently with a scrungie or clean washcloth.  5.  Apply the CHG Soap to your  body ONLY FROM THE NECK DOWN.   Do not use on face/ open                           Wound or open sores. Avoid contact with eyes, ears mouth and genitals (private parts).                       Wash face,  Genitals (private parts) with your normal soap.             6.  Wash thoroughly, paying special attention to the area where your surgery  will be performed.  7.  Thoroughly rinse your body with warm water from the neck down.  8.  DO NOT shower/wash with your normal soap after using and rinsing off  the CHG Soap.  9.  Pat yourself dry with a clean towel.            10.  Wear clean pajamas.            11.  Place clean sheets on your bed the night of your first shower and do not  sleep with pets. Day of Surgery : Do not apply any lotions/deodorants the morning of surgery.  Please wear clean clothes to the hospital/surgery center.  FAILURE TO FOLLOW THESE INSTRUCTIONS MAY RESULT IN THE CANCELLATION OF YOUR SURGERY PATIENT SIGNATURE_________________________________  NURSE SIGNATURE__________________________________  ________________________________________________________________________   Adam Phenix  An incentive spirometer is a tool that can help keep your lungs clear and active. This tool measures how well you are filling your lungs with each breath. Taking long deep breaths may help reverse or decrease the chance of developing breathing (pulmonary) problems (especially infection) following:  A long period of time when you are unable to move or be active. BEFORE THE PROCEDURE   If the spirometer includes an indicator to show your best effort, your nurse or respiratory therapist will set it to a desired goal.  If possible, sit up straight or lean slightly forward. Try not to slouch.  Hold the incentive spirometer in an upright position. INSTRUCTIONS FOR USE  1. Sit on the edge of your bed if possible, or sit up as far as you can in bed or on a chair. 2. Hold the  incentive spirometer in an upright position. 3. Breathe out normally. 4. Place the mouthpiece in your mouth and seal your lips tightly around it. 5. Breathe in slowly and as deeply as possible, raising the piston or the ball toward the top of the column. 6. Hold your breath for 3-5 seconds or for as long as possible. Allow the piston or ball to fall to the bottom of the column. 7. Remove the mouthpiece from your mouth and breathe out normally. 8. Rest for a few seconds and repeat Steps 1 through 7 at least 10 times every 1-2 hours when you are awake. Take your time and take a few normal breaths between deep breaths. 9. The spirometer may include an indicator to show your best effort. Use the indicator as a goal to work toward during each repetition. 10. After each set of 10 deep breaths, practice coughing to be sure your lungs are clear. If you have an incision (the cut made at the time of surgery), support your incision when coughing by placing a pillow or rolled up towels firmly against it. Once you are able to get out of bed, walk around indoors and cough well. You may stop using the incentive spirometer when instructed by your caregiver.  RISKS AND COMPLICATIONS  Take your time so you do not get dizzy or light-headed.  If you are in pain, you may need to take or ask for pain medication before doing incentive spirometry. It is harder to take a deep breath if you are having pain. AFTER USE  Rest and breathe slowly and easily.  It can be helpful to keep track of a log of your progress. Your caregiver can provide you with a simple table to help with this. If you are using the spirometer at home, follow these instructions: [place] IF:   You are having difficultly using the spirometer.  You have trouble using the spirometer as often as instructed.  Your pain medication is not giving enough relief while using the spirometer.  You develop  fever of 100.5 F (38.1 C) or  higher. SEEK IMMEDIATE MEDICAL CARE IF:   You cough up bloody sputum that had not been present before.  You develop fever of 102 F (38.9 C) or greater.  You develop worsening pain at or near the incision site. MAKE SURE YOU:   Understand these instructions.  Will watch your condition.  Will get help right away if you are not doing well or get worse. Document Released: 07/23/2006 Document Revised: 06/04/2011 Document Reviewed: 09/23/2006 ExitCare Patient Information 2014 Exit[employer], [place].   ________________________________________________________________________  WHAT IS A BLOOD TRANSFUSION? Blood Transfusion Information  A transfusion is the replacement of blood or some of its parts. Blood is made up of multiple cells which provide different functions.  Red blood cells carry oxygen and are used for blood loss replacement.  White blood cells fight against infection.  Platelets control bleeding.  Plasma helps clot blood.  Other blood products are available for specialized needs, such as hemophilia or other clotting disorders. BEFORE THE TRANSFUSION  Who gives blood for transfusions?   Healthy volunteers who are fully evaluated to make sure their blood is safe. This is blood bank blood. Transfusion therapy is the safest it has ever been in the practice of medicine. Before blood is taken from a donor, a complete history is taken to make sure that person has no history of diseases nor engages in risky social behavior (examples are intravenous drug use or sexual activity with multiple partners). The donor's travel history is screened to minimize risk of transmitting infections, such as malaria. The donated blood is tested for signs of infectious diseases, such as HIV and hepatitis. The blood is then tested to be sure it is compatible with you in order to minimize the chance of a transfusion reaction. If you or a relative donates blood, this is often done in anticipation of surgery  and is not appropriate for emergency situations. It takes many days to process the donated blood. RISKS AND COMPLICATIONS Although transfusion therapy is very safe and saves many lives, the main dangers of transfusion include:   Getting an infectious disease.  Developing a transfusion reaction. This is an allergic reaction to something in the blood you were given. Every precaution is taken to prevent this. The decision to have a blood transfusion has been considered carefully by your caregiver before blood is given. Blood is not given unless the benefits outweigh the risks. AFTER THE TRANSFUSION  Right after receiving a blood transfusion, you will usually feel much better and more energetic. This is especially true if your red blood cells have gotten low (anemic). The transfusion raises the level of the red blood cells which carry oxygen, and this usually causes an energy increase.  The nurse administering the transfusion will monitor you carefully for complications. HOME CARE INSTRUCTIONS  No special instructions are needed after a transfusion. You may find your energy is better. Speak with your caregiver about any limitations on activity for underlying diseases you may have. SEEK MEDICAL CARE IF:   Your condition is not improving after your transfusion.  You develop redness or irritation at the intravenous (IV) site. SEEK IMMEDIATE MEDICAL CARE IF:  Any of the following symptoms occur over the next 12 hours:  Shaking chills.  You have a temperature by mouth above 102 F (38.9 C), not controlled by medicine.  Chest, back, or muscle pain.  People around you feel you are not acting correctly or are confused.  Shortness of breath  or difficulty breathing.  Dizziness and fainting.  You get a rash or develop hives.  You have a decrease in urine output.  Your urine turns a dark color or changes to pink, red, or brown. Any of the following symptoms occur over the next 10  days:  You have a temperature by mouth above 102 F (38.9 C), not controlled by medicine.  Shortness of breath.  Weakness after normal activity.  The white part of the eye turns yellow (jaundice).  You have a decrease in the amount of urine or are urinating less often.  Your urine turns a dark color or changes to pink, red, or brown. Document Released: 03/09/2000 Document Revised: 06/04/2011 Document Reviewed: 10/27/2007 [facility] Patient Information 2014 [place], [place].  _______________________________________________________________________

## 2017-08-27 NOTE — H&P (Signed)
TOTAL HIP ADMISSION H&P  Patient is admitted for left total hip arthroplasty.  Subjective:  Chief Complaint: left hip pain  HPI: Omar Bridges, 77 y.o. male, has a history of pain and functional disability in the left hip(s) due to arthritis and patient has failed non-surgical conservative treatments for greater than 12 weeks to include NSAID's and/or analgesics, corticosteriod injections, viscosupplementation injections, flexibility and strengthening excercises and activity modification.  Onset of symptoms was gradual starting 3 years ago with gradually worsening course since that time.The patient noted no past surgery on the left hip(s).  Patient currently rates pain in the left hip at 7 out of 10 with activity. Patient has night pain, worsening of pain with activity and weight bearing, pain that interfers with activities of daily living and pain with passive range of motion. Patient has evidence of subchondral cysts, periarticular osteophytes and joint space narrowing by imaging studies. This condition presents safety issues increasing the risk of falls. There is no current active infection.  Patient Active Problem List   Diagnosis Date Noted  . Pain in shoulder 01/18/2015  . Encounter for other preprocedural examination 01/18/2015  . Abscess of back 11/03/2014  . Encounter for other specified aftercare 05/03/2014  . Gonalgia 04/14/2014  . Iliotibial band syndrome 04/14/2014  . 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 Routine health maintenance 11/05/2012  . Knee pain 09/05/2011  . CAD (coronary artery disease) of bypass graft 07/04/2010  . 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 Hyperlipidemia 06/02/2010  . CHEST PAIN-UNSPECIFIED 06/02/2010   Past Medical History:  Diagnosis Date  . Allergy   . Anginal pain (HCC) 08/20/2017   [facility] and had work uo- records on chart  . Arthritis   . Chicken pox   . Cholelithiasis   . Coronary  artery disease   . Diabetes mellitus without complication (HCC)   . ED (erectile dysfunction)   . Heart disease    multi-vessel dz LD, Crc. PDA, OM  . Hyperlipidemia   . Pericarditis 1978    Past Surgical History:  Procedure Laterality Date  . 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 CORONARY ARTERY BYPASS GRAFT  4/12   6 vessel: LIMA- LAD, SVG OM, PDA, Circ  . EYE SURGERY     bilateral cataract surgery with lens implants  . NASAL SEPTUM SURGERY    . ROTATOR CUFF REPAIR Right 01/2015    No current facility-administered medications for this encounter.    Current Outpatient Medications  Medication Sig Dispense Refill Last Dose  . ASPERCREME LIDOCAINE EX Apply 1 application topically 4 (four) times daily as needed (FOR PAIN).   Taking  . aspirin EC 81 MG tablet Take 81 mg by mouth daily.    Taking  . atorvastatin (LIPITOR) 80 MG tablet TAKE 1 TABLET BY MOUTH ONCE DAILY (Patient taking differently: TAKE 1 TABLET BY MOUTH ONCE DAILY WITH SUPPER) 90 tablet 3 Taking  . Cholecalciferol (VITAMIN D3) 2000 units TABS Take 2,000 units of lipase by mouth daily.   Taking  . diphenhydramine-acetaminophen (TYLENOL PM) 25-500 MG TABS tablet Take 2 tablets by mouth at bedtime as needed.   Taking  . fish oil-omega-3 fatty acids 1000 MG capsule Take 1 g by mouth every evening.    Taking  . fluticasone (FLONASE) 50 MCG/ACT nasal spray Place 1 spray into both nostrils daily. (Patient taking differently: Place 1 spray into both nostrils daily  as needed for allergies. ) 16 g 11 Taking  . lisinopril (PRINIVIL,ZESTRIL) 5 MG tablet TAKE 1 TABLET BY MOUTH ONCE DAILY 90 tablet 3 Taking  . metFORMIN (GLUCOPHAGE) 1000 MG tablet Take 1,000 mg by mouth 2 (two) times daily with a meal.   Taking  . metoprolol succinate (TOPROL-XL) 25 MG 24 hr tablet TAKE 1/2 (ONE-HALF) TABLET BY MOUTH ONCE DAILY 45 tablet 3 Taking  . Multiple Vitamins-Minerals (OCUVITE-LUTEIN PO) Take 1 tablet by mouth  daily.   Taking  . nitroGLYCERIN (NITROSTAT) 0.4 MG SL tablet Place 1 tablet (0.4 mg total) under the tongue every 5 (five) minutes as needed for chest pain. As needed/ as directed for chest pain. 25 tablet 11 Taking  . vitamin B-12 (CYANOCOBALAMIN) 500 MCG tablet Take 500 mcg by mouth daily.   Taking   Allergies  Allergen Reactions  . Sulfa Antibiotics Nausea And Vomiting    Social History   Tobacco Use  . Smoking status: Never Smoker  . Smokeless tobacco: Former Neurosurgeon    Types: Chew  Substance Use Topics  . Alcohol use: No    Family History  Problem Relation Age of Onset  . Other Mother 17       scarlet fever, died during child birth with complications of scarlet fever  . COPD Father   . Heart disease Father   . Congestive Heart Failure Father 42  . Coronary artery disease Neg Hx   . Diabetes Neg Hx   . Cancer Neg Hx      Review of Systems  Constitutional: Negative.   HENT: Negative.   Eyes: Negative.   Respiratory: Negative.   Cardiovascular: Negative.   Gastrointestinal: Negative.   Genitourinary: Negative.   Musculoskeletal: Positive for joint pain and myalgias. Negative for back pain, falls and neck pain.  Skin: Negative.   Neurological: Negative.   Endo/Heme/Allergies: Negative.   Psychiatric/Behavioral: Negative.     Objective:  Physical Exam  Constitutional: He is oriented to person, place, and time. He appears well-developed and well-nourished. No distress.  HENT:  Head: Normocephalic and atraumatic.  Right Ear: External ear normal.  Left Ear: External ear normal.  Nose: Nose normal.  Mouth/Throat: Oropharynx is clear and moist.  Eyes: Conjunctivae and EOM are normal.  Neck: Normal range of motion. Neck supple.  Cardiovascular: Normal rate, regular rhythm, normal heart sounds and intact distal pulses.  No murmur heard. Respiratory: Effort normal and breath sounds normal. No respiratory distress. He has no wheezes.  GI: Soft. Bowel sounds are normal.  He exhibits no distension. There is no tenderness.  Musculoskeletal:  Pain with passive and active motion of the right and left hips. ROM right hip 120 degrees flexion, 10 degrees internal rotation, 20 degrees external rotation, and 25 degrees abduction. ROM left hip 120 degrees flexion, 10 degrees internal rotation, 20 degrees external rotation, and 25 degrees abduction. Motion of the right and left hips causes pain into the knees. No effusion or tenderness to palpation about the knees. Flexion to 125 both knees but he lacks about 10 degrees of extension in both knees.  Neurological: He is alert and oriented to person, place, and time. He has normal strength. No sensory deficit.  Skin: No rash noted. He is not diaphoretic. No erythema.  Psychiatric: He has a normal mood and affect. His behavior is normal.    Ht: 6 ft  Wt: 197 lbs  BMI: 26.7  BP: 118/70 sitting L arm  Pulse: 68 bpm   Imaging  Review Plain radiographs demonstrate severe degenerative joint disease of the left hip(s). The bone quality appears to be good for age and reported activity level.    Preoperative templating of the joint replacement has been completed, documented, and submitted to the Operating Room personnel in order to optimize intra-operative equipment management.     Assessment/Plan:  End stage primary osteoarthritis, left hip(s)  The patient history, physical examination, clinical judgement of the provider and imaging studies are consistent with end stage degenerative joint disease of the left hip(s) and total hip arthroplasty is deemed medically necessary. The treatment options including medical management, injection therapy, arthroscopy and arthroplasty were discussed at length. The risks and benefits of total hip arthroplasty were presented and reviewed. The risks due to aseptic loosening, infection, stiffness, dislocation/subluxation,  thromboembolic complications and other imponderables were discussed.  The  patient acknowledged the explanation, agreed to proceed with the plan and consent was signed. Patient is being admitted for inpatient treatment for surgery, pain control, PT, OT, prophylactic antibiotics, VTE prophylaxis, progressive ambulation and ADL's and discharge planning.The patient is planning to be discharged home with home health services vs HEP depending on progress.  Therapy Plans: HHPT vs HEP Disposition: Home with wife Planned DVT prophylaxis: Xarelto 10mg  daily vs asipirin 325mg  BID DME needed: has equipment PCP: Mattie Marlin Cardio: Jens Som Other: topical TXA  Dimitri Ped, PA-C

## 2017-08-27 NOTE — Progress Notes (Signed)
Called patient and informed him of time change for surgery. Instructed patient to arrive at Ad[facility] at 1030 am and instructed him to have clear liquids only from midnight up until 0700 am then nothing until after surgery! Patient verbalized understanding.

## 2017-08-28 ENCOUNTER — Encounter (HOSPITAL_COMMUNITY)
Admission: RE | Disposition: A | Discharge status: Home / Self Care | Payer: Self-pay | Source: Home / Self Care | Attending: Orthopedic Surgery

## 2017-08-28 ENCOUNTER — Inpatient Hospital Stay (HOSPITAL_COMMUNITY): Payer: Medicare HMO | Admitting: Anesthesiology

## 2017-08-28 ENCOUNTER — Other Ambulatory Visit: Payer: Self-pay

## 2017-08-28 ENCOUNTER — Encounter (HOSPITAL_COMMUNITY): Payer: Self-pay | Admitting: *Deleted

## 2017-08-28 ENCOUNTER — Inpatient Hospital Stay (HOSPITAL_COMMUNITY): Payer: Medicare HMO

## 2017-08-28 ENCOUNTER — Inpatient Hospital Stay (HOSPITAL_COMMUNITY)
Admission: RE | Admit: 2017-08-28 | Discharge: 2017-08-30 | DRG: 470 | Disposition: A | Discharge status: Home / Self Care | Payer: Medicare HMO | Attending: Orthopedic Surgery | Admitting: Orthopedic Surgery

## 2017-08-28 DIAGNOSIS — M7630 Iliotibial band syndrome, unspecified leg: Secondary | ICD-10-CM | POA: Diagnosis present

## 2017-08-28 DIAGNOSIS — M1612 Unilateral primary osteoarthritis, left hip: Secondary | ICD-10-CM | POA: Diagnosis present

## 2017-08-28 DIAGNOSIS — Z961 Presence of intraocular lens: Secondary | ICD-10-CM | POA: Diagnosis present

## 2017-08-28 DIAGNOSIS — Z8249 Family history of ischemic heart disease and other diseases of the circulatory system: Secondary | ICD-10-CM

## 2017-08-28 DIAGNOSIS — M25752 Osteophyte, left hip: Secondary | ICD-10-CM | POA: Diagnosis present

## 2017-08-28 DIAGNOSIS — Z825 Family history of asthma and other chronic lower respiratory diseases: Secondary | ICD-10-CM | POA: Diagnosis not present

## 2017-08-28 DIAGNOSIS — Z9841 Cataract extraction status, right eye: Secondary | ICD-10-CM

## 2017-08-28 DIAGNOSIS — I2510 Atherosclerotic heart disease of native coronary artery without angina pectoris: Secondary | ICD-10-CM | POA: Diagnosis present

## 2017-08-28 DIAGNOSIS — R339 Retention of urine, unspecified: Secondary | ICD-10-CM | POA: Diagnosis present

## 2017-08-28 DIAGNOSIS — Z7984 Long term (current) use of oral hypoglycemic drugs: Secondary | ICD-10-CM

## 2017-08-28 DIAGNOSIS — Z882 Allergy status to sulfonamides status: Secondary | ICD-10-CM

## 2017-08-28 DIAGNOSIS — Z79899 Other long term (current) drug therapy: Secondary | ICD-10-CM | POA: Diagnosis not present

## 2017-08-28 DIAGNOSIS — Z89022 Acquired absence of left finger(s): Secondary | ICD-10-CM | POA: Diagnosis not present

## 2017-08-28 DIAGNOSIS — Z96649 Presence of unspecified artificial hip joint: Secondary | ICD-10-CM

## 2017-08-28 DIAGNOSIS — Z7982 Long term (current) use of aspirin: Secondary | ICD-10-CM

## 2017-08-28 DIAGNOSIS — Z951 Presence of aortocoronary bypass graft: Secondary | ICD-10-CM

## 2017-08-28 DIAGNOSIS — I10 Essential (primary) hypertension: Secondary | ICD-10-CM | POA: Diagnosis present

## 2017-08-28 DIAGNOSIS — M169 Osteoarthritis of hip, unspecified: Secondary | ICD-10-CM | POA: Diagnosis present

## 2017-08-28 DIAGNOSIS — Z0000 Encounter for general adult medical examination without abnormal findings: Secondary | ICD-10-CM

## 2017-08-28 DIAGNOSIS — Z9842 Cataract extraction status, left eye: Secondary | ICD-10-CM | POA: Diagnosis not present

## 2017-08-28 DIAGNOSIS — E785 Hyperlipidemia, unspecified: Secondary | ICD-10-CM | POA: Diagnosis present

## 2017-08-28 HISTORY — PX: TOTAL HIP ARTHROPLASTY: SHX124

## 2017-08-28 LAB — GLUCOSE, CAPILLARY
Glucose-Capillary: 119 mg/dL — ABNORMAL HIGH (ref 65–99)
Glucose-Capillary: 142 mg/dL — ABNORMAL HIGH (ref 65–99)

## 2017-08-28 LAB — TYPE AND SCREEN
ABO/RH(D): O POS
Antibody Screen: NEGATIVE

## 2017-08-28 SURGERY — ARTHROPLASTY, HIP, TOTAL, ANTERIOR APPROACH
Anesthesia: Spinal | Site: Hip | Laterality: Left

## 2017-08-28 MED ORDER — DIPHENHYDRAMINE HCL 12.5 MG/5ML PO ELIX: 12.5000 mg | ORAL_SOLUTION | ORAL | Status: DC | PRN

## 2017-08-28 MED ORDER — MIDAZOLAM HCL 5 MG/5ML IJ SOLN
INTRAMUSCULAR | Status: DC | PRN
  Administered 2017-08-28: 1 mg via INTRAVENOUS
  Administered 2017-08-28 (×2): 0.5 mg via INTRAVENOUS

## 2017-08-28 MED ORDER — TRANEXAMIC ACID 1000 MG/10ML IV SOLN
INTRAVENOUS | Status: DC | PRN
  Administered 2017-08-28: 2000 mg via TOPICAL

## 2017-08-28 MED ORDER — CEFAZOLIN SODIUM-DEXTROSE 2-4 GM/100ML-% IV SOLN
2.0000 g | INTRAVENOUS | Status: AC
  Administered 2017-08-28: 2 g via INTRAVENOUS
  Filled 2017-08-28: qty 100

## 2017-08-28 MED ORDER — METHOCARBAMOL 500 MG PO TABS
500.0000 mg | ORAL_TABLET | Freq: Four times a day (QID) | ORAL | Status: DC | PRN
  Administered 2017-08-28 – 2017-08-29 (×2): 500 mg via ORAL
  Filled 2017-08-28 (×2): qty 1

## 2017-08-28 MED ORDER — DEXAMETHASONE SODIUM PHOSPHATE 10 MG/ML IJ SOLN
10.0000 mg | Freq: Once | INTRAMUSCULAR | Status: AC
  Administered 2017-08-29: 10 mg via INTRAVENOUS
  Filled 2017-08-28: qty 1

## 2017-08-28 MED ORDER — RIVAROXABAN 10 MG PO TABS
10.0000 mg | ORAL_TABLET | Freq: Every day | ORAL | Status: DC
  Administered 2017-08-29 – 2017-08-30 (×2): 10 mg via ORAL
  Filled 2017-08-28 (×2): qty 1

## 2017-08-28 MED ORDER — METHOCARBAMOL 1000 MG/10ML IJ SOLN
500.0000 mg | Freq: Four times a day (QID) | INTRAVENOUS | Status: DC | PRN
  Administered 2017-08-28: 500 mg via INTRAVENOUS
  Filled 2017-08-28: qty 550

## 2017-08-28 MED ORDER — PROPOFOL 10 MG/ML IV BOLUS
INTRAVENOUS | Status: DC | PRN
  Administered 2017-08-28: 20 mg via INTRAVENOUS

## 2017-08-28 MED ORDER — GLYCOPYRROLATE PF 0.2 MG/ML IJ SOSY
PREFILLED_SYRINGE | INTRAMUSCULAR | Status: DC | PRN
  Administered 2017-08-28: .2 mg via INTRAVENOUS

## 2017-08-28 MED ORDER — BUPIVACAINE IN DEXTROSE 0.75-8.25 % IT SOLN
INTRATHECAL | Status: DC | PRN
  Administered 2017-08-28: 2 mL via INTRATHECAL

## 2017-08-28 MED ORDER — OXYCODONE HCL 5 MG PO TABS
5.0000 mg | ORAL_TABLET | ORAL | Status: DC | PRN
  Administered 2017-08-28 – 2017-08-30 (×5): 10 mg via ORAL
  Filled 2017-08-28 (×7): qty 2

## 2017-08-28 MED ORDER — FENTANYL CITRATE (PF) 100 MCG/2ML IJ SOLN
INTRAMUSCULAR | Status: DC | PRN
  Administered 2017-08-28 (×2): 25 ug via INTRAVENOUS
  Administered 2017-08-28: 50 ug via INTRAVENOUS

## 2017-08-28 MED ORDER — HYDROMORPHONE HCL 1 MG/ML IJ SOLN
INTRAMUSCULAR | Status: AC
  Filled 2017-08-28: qty 1

## 2017-08-28 MED ORDER — MENTHOL 3 MG MT LOZG
1.0000 | LOZENGE | OROMUCOSAL | Status: DC | PRN
  Filled 2017-08-28: qty 9

## 2017-08-28 MED ORDER — LACTATED RINGERS IV SOLN
INTRAVENOUS | Status: DC
  Administered 2017-08-28 (×2): via INTRAVENOUS

## 2017-08-28 MED ORDER — PHENOL 1.4 % MT LIQD: 1.0000 | OROMUCOSAL | Status: DC | PRN

## 2017-08-28 MED ORDER — BUPIVACAINE-EPINEPHRINE (PF) 0.25% -1:200000 IJ SOLN
INTRAMUSCULAR | Status: DC | PRN
  Administered 2017-08-28: 30 mL via PERINEURAL

## 2017-08-28 MED ORDER — FLUTICASONE PROPIONATE 50 MCG/ACT NA SUSP
1.0000 | Freq: Every day | NASAL | Status: DC | PRN
  Administered 2017-08-29: 1 via NASAL
  Filled 2017-08-28 (×2): qty 16

## 2017-08-28 MED ORDER — SODIUM CHLORIDE 0.9 % IV SOLN
INTRAVENOUS | Status: DC
  Administered 2017-08-28 – 2017-08-29 (×2): via INTRAVENOUS

## 2017-08-28 MED ORDER — OXYCODONE HCL 5 MG PO TABS
10.0000 mg | ORAL_TABLET | ORAL | Status: DC | PRN
  Administered 2017-08-29: 10 mg via ORAL

## 2017-08-28 MED ORDER — ACETAMINOPHEN 10 MG/ML IV SOLN
1000.0000 mg | Freq: Four times a day (QID) | INTRAVENOUS | Status: DC
  Administered 2017-08-28: 1000 mg via INTRAVENOUS
  Filled 2017-08-28: qty 100

## 2017-08-28 MED ORDER — BISACODYL 10 MG RE SUPP: 10.0000 mg | Freq: Every day | RECTAL | Status: DC | PRN

## 2017-08-28 MED ORDER — METOPROLOL SUCCINATE ER 25 MG PO TB24
12.5000 mg | ORAL_TABLET | Freq: Every day | ORAL | Status: DC
  Administered 2017-08-29 – 2017-08-30 (×2): 12.5 mg via ORAL
  Filled 2017-08-28 (×2): qty 1

## 2017-08-28 MED ORDER — METOCLOPRAMIDE HCL 5 MG/ML IJ SOLN: 5.0000 mg | Freq: Three times a day (TID) | INTRAMUSCULAR | Status: DC | PRN

## 2017-08-28 MED ORDER — CEFAZOLIN SODIUM-DEXTROSE 2-4 GM/100ML-% IV SOLN
2.0000 g | Freq: Four times a day (QID) | INTRAVENOUS | Status: AC
  Administered 2017-08-28 – 2017-08-29 (×2): 2 g via INTRAVENOUS
  Filled 2017-08-28 (×2): qty 100

## 2017-08-28 MED ORDER — TRAMADOL HCL 50 MG PO TABS
50.0000 mg | ORAL_TABLET | Freq: Four times a day (QID) | ORAL | Status: DC | PRN
  Filled 2017-08-28: qty 2

## 2017-08-28 MED ORDER — MEPERIDINE HCL 50 MG/ML IJ SOLN: 6.2500 mg | INTRAMUSCULAR | Status: DC | PRN

## 2017-08-28 MED ORDER — SODIUM CHLORIDE 0.9 % IV SOLN
2000.0000 mg | INTRAVENOUS | Status: AC
  Filled 2017-08-28: qty 20

## 2017-08-28 MED ORDER — PHENYLEPHRINE HCL 10 MG/ML IJ SOLN
INTRAMUSCULAR | Status: AC
  Filled 2017-08-28: qty 4

## 2017-08-28 MED ORDER — ONDANSETRON HCL 4 MG PO TABS
4.0000 mg | ORAL_TABLET | Freq: Four times a day (QID) | ORAL | Status: DC | PRN
  Administered 2017-08-29: 4 mg via ORAL
  Filled 2017-08-28: qty 1

## 2017-08-28 MED ORDER — BUPIVACAINE-EPINEPHRINE (PF) 0.25% -1:200000 IJ SOLN
INTRAMUSCULAR | Status: AC
  Filled 2017-08-28: qty 30

## 2017-08-28 MED ORDER — FENTANYL CITRATE (PF) 100 MCG/2ML IJ SOLN
INTRAMUSCULAR | Status: AC
  Filled 2017-08-28: qty 2

## 2017-08-28 MED ORDER — DOCUSATE SODIUM 100 MG PO CAPS
100.0000 mg | ORAL_CAPSULE | Freq: Two times a day (BID) | ORAL | Status: DC
  Administered 2017-08-28 – 2017-08-30 (×4): 100 mg via ORAL
  Filled 2017-08-28 (×4): qty 1

## 2017-08-28 MED ORDER — DEXTROSE 5 % IV SOLN
INTRAVENOUS | Status: DC | PRN
  Administered 2017-08-28: 20 ug/min via INTRAVENOUS

## 2017-08-28 MED ORDER — MIDAZOLAM HCL 2 MG/2ML IJ SOLN
INTRAMUSCULAR | Status: AC
  Filled 2017-08-28: qty 2

## 2017-08-28 MED ORDER — ONDANSETRON HCL 4 MG/2ML IJ SOLN: 4.0000 mg | Freq: Once | INTRAMUSCULAR | Status: DC | PRN

## 2017-08-28 MED ORDER — NITROGLYCERIN 0.4 MG SL SUBL: 0.4000 mg | SUBLINGUAL_TABLET | SUBLINGUAL | Status: DC | PRN

## 2017-08-28 MED ORDER — HYDROMORPHONE HCL 1 MG/ML IJ SOLN
0.5000 mg | INTRAMUSCULAR | Status: DC | PRN
  Administered 2017-08-29: 0.5 mg via INTRAVENOUS
  Filled 2017-08-28: qty 1

## 2017-08-28 MED ORDER — ATORVASTATIN CALCIUM 40 MG PO TABS
80.0000 mg | ORAL_TABLET | Freq: Every day | ORAL | Status: DC
  Administered 2017-08-28 – 2017-08-29 (×2): 80 mg via ORAL
  Filled 2017-08-28 (×2): qty 2

## 2017-08-28 MED ORDER — POLYETHYLENE GLYCOL 3350 17 G PO PACK: 17.0000 g | PACK | Freq: Every day | ORAL | Status: DC | PRN

## 2017-08-28 MED ORDER — PROPOFOL 10 MG/ML IV BOLUS
INTRAVENOUS | Status: AC
  Filled 2017-08-28: qty 60

## 2017-08-28 MED ORDER — FLEET ENEMA 7-19 GM/118ML RE ENEM: 1.0000 | ENEMA | Freq: Once | RECTAL | Status: DC | PRN

## 2017-08-28 MED ORDER — ONDANSETRON HCL 4 MG/2ML IJ SOLN
INTRAMUSCULAR | Status: DC | PRN
  Administered 2017-08-28: 4 mg via INTRAVENOUS

## 2017-08-28 MED ORDER — ALBUMIN HUMAN 5 % IV SOLN
INTRAVENOUS | Status: AC
  Filled 2017-08-28: qty 500

## 2017-08-28 MED ORDER — ONDANSETRON HCL 4 MG/2ML IJ SOLN
4.0000 mg | Freq: Four times a day (QID) | INTRAMUSCULAR | Status: DC | PRN
  Administered 2017-08-29: 4 mg via INTRAVENOUS
  Filled 2017-08-28: qty 2

## 2017-08-28 MED ORDER — PROPOFOL 500 MG/50ML IV EMUL
INTRAVENOUS | Status: DC | PRN
  Administered 2017-08-28: 55 ug/kg/min via INTRAVENOUS

## 2017-08-28 MED ORDER — ACETAMINOPHEN 325 MG PO TABS
325.0000 mg | ORAL_TABLET | Freq: Four times a day (QID) | ORAL | Status: DC | PRN
  Administered 2017-08-29: 650 mg via ORAL
  Filled 2017-08-28: qty 2

## 2017-08-28 MED ORDER — DEXAMETHASONE SODIUM PHOSPHATE 10 MG/ML IJ SOLN
8.0000 mg | Freq: Once | INTRAMUSCULAR | Status: AC
  Administered 2017-08-28: 10 mg via INTRAVENOUS

## 2017-08-28 MED ORDER — HYDROMORPHONE HCL 1 MG/ML IJ SOLN
0.2500 mg | INTRAMUSCULAR | Status: DC | PRN
  Administered 2017-08-28 (×4): 0.5 mg via INTRAVENOUS

## 2017-08-28 MED ORDER — CHLORHEXIDINE GLUCONATE 4 % EX LIQD: 60.0000 mL | Freq: Once | CUTANEOUS | Status: DC

## 2017-08-28 MED ORDER — ACETAMINOPHEN 500 MG PO TABS
1000.0000 mg | ORAL_TABLET | Freq: Four times a day (QID) | ORAL | Status: AC
  Administered 2017-08-28: 500 mg via ORAL
  Administered 2017-08-29 (×2): 1000 mg via ORAL
  Filled 2017-08-28 (×3): qty 2

## 2017-08-28 MED ORDER — SODIUM CHLORIDE 0.9 % IR SOLN
Status: DC | PRN
  Administered 2017-08-28: 1000 mL

## 2017-08-28 MED ORDER — METOCLOPRAMIDE HCL 5 MG PO TABS
5.0000 mg | ORAL_TABLET | Freq: Three times a day (TID) | ORAL | Status: DC | PRN
  Administered 2017-08-29 – 2017-08-30 (×3): 5 mg via ORAL
  Filled 2017-08-28 (×4): qty 1

## 2017-08-28 SURGICAL SUPPLY — 41 items
BAG DECANTER FOR FLEXI CONT (MISCELLANEOUS) ×3 IMPLANT
BAG ZIPLOCK 12X15 (MISCELLANEOUS) IMPLANT
BLADE EXTENDED COATED 6.5IN (ELECTRODE) ×3 IMPLANT
BLADE SAG 18X100X1.27 (BLADE) ×3 IMPLANT
CAPT HIP TOTAL 2 ×3 IMPLANT
CLOSURE WOUND 1/2 X4 (GAUZE/BANDAGES/DRESSINGS) ×1
CLOTH BEACON ORANGE TIMEOUT ST (SAFETY) ×3 IMPLANT
COVER PERINEAL POST (MISCELLANEOUS) ×3 IMPLANT
COVER SURGICAL LIGHT HANDLE (MISCELLANEOUS) ×3 IMPLANT
DECANTER SPIKE VIAL GLASS SM (MISCELLANEOUS) ×3 IMPLANT
DRAPE STERI IOBAN 125X83 (DRAPES) ×3 IMPLANT
DRAPE U-SHAPE 47X51 STRL (DRAPES) ×6 IMPLANT
DRSG ADAPTIC 3X8 NADH LF (GAUZE/BANDAGES/DRESSINGS) ×3 IMPLANT
DRSG MEPILEX BORDER 4X4 (GAUZE/BANDAGES/DRESSINGS) ×3 IMPLANT
DRSG MEPILEX BORDER 4X8 (GAUZE/BANDAGES/DRESSINGS) ×3 IMPLANT
DURAPREP 26ML APPLICATOR (WOUND CARE) ×3 IMPLANT
ELECT REM PT RETURN 15FT ADLT (MISCELLANEOUS) ×3 IMPLANT
EVACUATOR 1/8 PVC DRAIN (DRAIN) ×3 IMPLANT
GLOVE BIO SURGEON STRL SZ8 (GLOVE) ×6 IMPLANT
GLOVE BIOGEL PI IND STRL 6.5 (GLOVE) ×1 IMPLANT
GLOVE BIOGEL PI IND STRL 7.0 (GLOVE) ×1 IMPLANT
GLOVE BIOGEL PI IND STRL 8 (GLOVE) ×2 IMPLANT
GLOVE BIOGEL PI INDICATOR 6.5 (GLOVE) ×2
GLOVE BIOGEL PI INDICATOR 7.0 (GLOVE) ×2
GLOVE BIOGEL PI INDICATOR 8 (GLOVE) ×4
GLOVE SURG SS PI 6.5 STRL IVOR (GLOVE) ×3 IMPLANT
GLOVE SURG SS PI 7.0 STRL IVOR (GLOVE) ×3 IMPLANT
GOWN STRL REUS W/TWL LRG LVL3 (GOWN DISPOSABLE) ×6 IMPLANT
GOWN STRL REUS W/TWL XL LVL3 (GOWN DISPOSABLE) ×3 IMPLANT
PACK ANTERIOR HIP CUSTOM (KITS) ×3 IMPLANT
SHIELD SPLASH 9X12 PIC/PGM (MISCELLANEOUS) ×3 IMPLANT
STRIP CLOSURE SKIN 1/2X4 (GAUZE/BANDAGES/DRESSINGS) ×2 IMPLANT
SUT ETHIBOND NAB CT1 #1 30IN (SUTURE) ×3 IMPLANT
SUT MNCRL AB 4-0 PS2 18 (SUTURE) ×3 IMPLANT
SUT STRATAFIX 0 PDS 27 VIOLET (SUTURE) ×3
SUT VIC AB 2-0 CT1 27 (SUTURE) ×4
SUT VIC AB 2-0 CT1 TAPERPNT 27 (SUTURE) ×2 IMPLANT
SUTURE STRATFX 0 PDS 27 VIOLET (SUTURE) ×1 IMPLANT
SYR 50ML LL SCALE MARK (SYRINGE) IMPLANT
TRAY FOLEY MTR SLVR 16FR STAT (SET/KITS/TRAYS/PACK) ×3 IMPLANT
YANKAUER SUCT BULB TIP 10FT TU (MISCELLANEOUS) ×3 IMPLANT

## 2017-08-28 NOTE — Discharge Instructions (Addendum)
° °Dr. Frank Aluisio °Total Joint Specialist °[facility] °[address] °[place], [place] [address] °[phone] ° °ANTERIOR APPROACH TOTAL HIP REPLACEMENT POSTOPERATIVE DIRECTIONS ° ° °Hip Rehabilitation, Guidelines Following Surgery  °The results of a hip operation are greatly improved after range of motion and muscle strengthening exercises. Follow all safety measures which are given to protect your hip. If any of these exercises cause increased pain or swelling in your joint, decrease the amount until you are comfortable again. Then slowly increase the exercises. Call your caregiver if you have problems or questions.  ° °HOME CARE INSTRUCTIONS  °Remove items at home which could result in a fall. This includes throw rugs or furniture in walking pathways.  °· ICE to the affected hip every three hours for 30 minutes at a time and then as needed for pain and swelling.  Continue to use ice on the hip for pain and swelling from surgery. You may notice swelling that will progress down to the foot and ankle.  This is normal after surgery.  Elevate the leg when you are not up walking on it.   °· Continue to use the breathing machine which will help keep your temperature down.  It is common for your temperature to cycle up and down following surgery, especially at night when you are not up moving around and exerting yourself.  The breathing machine keeps your lungs expanded and your temperature down. ° ° °DIET °You may resume your previous home diet once your are discharged from the hospital. ° °DRESSING / WOUND CARE / SHOWERING °You may change your dressing every day with sterile gauze.  Please use good hand washing techniques before changing the dressing.  Do not use any lotions or creams on the incision until instructed by your surgeon. °You may start showering once you are discharged home but do not submerge the incision under water. Just pat the incision dry and apply a dry gauze dressing on  daily. °Change the surgical dressing daily and reapply a dry dressing each time. ° °ACTIVITY °Walk with your walker as instructed. °Use walker as long as suggested by your caregivers. °Avoid periods of inactivity such as sitting longer than an hour when not asleep. This helps prevent blood clots.  °You may resume a sexual relationship in one month or when given the OK by your doctor.  °You may return to work once you are cleared by your doctor.  °Do not drive a car for 6 weeks or until released by you surgeon.  °Do not drive while taking narcotics. ° °WEIGHT BEARING °Weight bearing as tolerated with assist device (walker, cane, etc) as directed, use it as long as suggested by your surgeon or therapist, typically at least 4-6 weeks. ° °POSTOPERATIVE CONSTIPATION PROTOCOL °Constipation - defined medically as fewer than three stools per week and severe constipation as less than one stool per week. ° °One of the most common issues patients have following surgery is constipation.  Even if you have a regular bowel pattern at home, your normal regimen is likely to be disrupted due to multiple reasons following surgery.  Combination of anesthesia, postoperative narcotics, change in appetite and fluid intake all can affect your bowels.  In order to avoid complications following surgery, here are some recommendations in order to help you during your recovery period. ° °Colace (docusate) - Pick up an over-the-counter form of Colace or another stool softener and take twice a day as long as you are requiring postoperative pain   medications.  Take with a full glass of water daily.  If you experience loose stools or diarrhea, hold the colace until you stool forms back up.  If your symptoms do not get better within 1 week or if they get worse, check with your doctor. ° °Dulcolax (bisacodyl) - Pick up over-the-counter and take as directed by the product packaging as needed to assist with the movement of your bowels.  Take with a full  glass of water.  Use this product as needed if not relieved by Colace only.  ° °MiraLax (polyethylene glycol) - Pick up over-the-counter to have on hand.  MiraLax is a solution that will increase the amount of water in your bowels to assist with bowel movements.  Take as directed and can mix with a glass of water, juice, soda, coffee, or tea.  Take if you go more than two days without a movement. °Do not use MiraLax more than once per day. Call your doctor if you are still constipated or irregular after using this medication for 7 days in a row. ° °If you continue to have problems with postoperative constipation, please contact the office for further assistance and recommendations.  If you experience "the worst abdominal pain ever" or develop nausea or vomiting, please contact the office immediatly for further recommendations for treatment. ° °ITCHING ° If you experience itching with your medications, try taking only a single pain pill, or even half a pain pill at a time.  You can also use Benadryl over the counter for itching or also to help with sleep.  ° °TED HOSE STOCKINGS °Wear the elastic stockings on both legs for three weeks following surgery during the day but you may remove then at night for sleeping. ° °MEDICATIONS °See your medication summary on the “After Visit Summary” that the nursing staff will review with you prior to discharge.  You may have some home medications which will be placed on hold until you complete the course of blood thinner medication.  It is important for you to complete the blood thinner medication as prescribed by your surgeon.  Continue your approved medications as instructed at time of discharge. ° °PRECAUTIONS °If you experience chest pain or shortness of breath - call 911 immediately for transfer to the hospital emergency department.  °If you develop a fever greater that 101 F, purulent drainage from wound, increased redness or drainage from wound, foul odor from the  wound/dressing, or calf pain - CONTACT YOUR SURGEON.   °                                                °FOLLOW-UP APPOINTMENTS °Make sure you keep all of your appointments after your operation with your surgeon and caregivers. You should call the office at the above phone number and make an appointment for approximately two weeks after the date of your surgery or on the date instructed by your surgeon outlined in the "After Visit Summary". ° °RANGE OF MOTION AND STRENGTHENING EXERCISES  °These exercises are designed to help you keep full movement of your hip joint. Follow your caregiver's or physical therapist's instructions. Perform all exercises about fifteen times, three times per day or as directed. Exercise both hips, even if you have had only one joint replacement. These exercises can be done on a training (exercise) mat, on the floor, on   a table or on a bed. Use whatever works the best and is most comfortable for you. Use music or television while you are exercising so that the exercises are a pleasant break in your day. This will make your life better with the exercises acting as a break in routine you can look forward to.  °Lying on your back, slowly slide your foot toward your buttocks, raising your knee up off the floor. Then slowly slide your foot back down until your leg is straight again.  °Lying on your back spread your legs as far apart as you can without causing discomfort.  °Lying on your side, raise your upper leg and foot straight up from the floor as far as is comfortable. Slowly lower the leg and repeat.  °Lying on your back, tighten up the muscle in the front of your thigh (quadriceps muscles). You can do this by keeping your leg straight and trying to raise your heel off the floor. This helps strengthen the largest muscle supporting your knee.  °Lying on your back, tighten up the muscles of your buttocks both with the legs straight and with the knee bent at a comfortable angle while keeping  your heel on the floor.  ° °IF YOU ARE TRANSFERRED TO A SKILLED REHAB FACILITY °If the patient is transferred to a skilled rehab facility following release from the hospital, a list of the current medications will be sent to the facility for the patient to continue.  When discharged from the skilled rehab facility, please have the facility set up the patient's Home Health Physical Therapy prior to being released. Also, the skilled facility will be responsible for providing the patient with their medications at time of release from the facility to include their pain medication, the muscle relaxants, and their blood thinner medication. If the patient is still at the rehab facility at time of the two week follow up appointment, the skilled rehab facility will also need to assist the patient in arranging follow up appointment in our office and any transportation needs. ° °MAKE SURE YOU:  °Understand these instructions.  °Get help right away if you are not doing well or get worse.  ° ° °Pick up stool softner and laxative for home use following surgery while on pain medications. °Do not submerge incision under water. °Please use good hand washing techniques while changing dressing each day. °May shower starting three days after surgery. °Please use a clean towel to pat the incision dry following showers. °Continue to use ice for pain and swelling after surgery. °Do not use any lotions or creams on the incision until instructed by your surgeon. ° °Information on my medicine - XARELTO® (Rivaroxaban) ° °Why was Xarelto® prescribed for you? °Xarelto® was prescribed for you to reduce the risk of blood clots forming after orthopedic surgery. The medical term for these abnormal blood clots is venous thromboembolism (VTE). ° °What do you need to know about xarelto® ? °Take your Xarelto® ONCE DAILY at the same time every day. °You may take it either with or without food. ° °If you have difficulty swallowing the tablet whole, you  may crush it and mix in applesauce just prior to taking your dose. ° °Take Xarelto® exactly as prescribed by your doctor and DO NOT stop taking Xarelto® without talking to the doctor who prescribed the medication.  Stopping without other VTE prevention medication to take the place of Xarelto® may increase your risk of developing a clot. ° °After discharge, you should   have regular check-up appointments with your healthcare provider that is prescribing your Xarelto®.   ° °What do you do if you miss a dose? °If you miss a dose, take it as soon as you remember on the same day then continue your regularly scheduled once daily regimen the next day. Do not take two doses of Xarelto® on the same day.  ° °Important Safety Information °A possible side effect of Xarelto® is bleeding. You should call your healthcare provider right away if you experience any of the following: °? Bleeding from an injury or your nose that does not stop. °? Unusual colored urine (red or dark brown) or unusual colored stools (red or black). °? Unusual bruising for unknown reasons. °? A serious fall or if you hit your head (even if there is no bleeding). ° °Some medicines may interact with Xarelto® and might increase your risk of bleeding while on Xarelto®. To help avoid this, consult your healthcare provider or pharmacist prior to using any new prescription or non-prescription medications, including herbals, vitamins, non-steroidal anti-inflammatory drugs (NSAIDs) and supplements. ° °This website has more information on Xarelto®: www.xarelto.com. ° ° °

## 2017-08-28 NOTE — Op Note (Signed)
OPERATIVE REPORT- TOTAL HIP ARTHROPLASTY   PREOPERATIVE DIAGNOSIS: Osteoarthritis of the Left hip.   POSTOPERATIVE DIAGNOSIS: Osteoarthritis of the Left  hip.   PROCEDURE: Left total hip arthroplasty, anterior approach.   SURGEON: Ollen GrossFrank Kaoru Rezendes, MD   ASSISTANT: Silva BandyKristi Edmisten PA-C  ANESTHESIA:  Spinal  ESTIMATED BLOOD LOSS:-350 mL    DRAINS: Hemovac x1.   COMPLICATIONS: None   CONDITION: PACU - hemodynamically stable.   BRIEF CLINICAL NOTE: Omar Bridges is a 77 y.o. male who has advanced end-  stage arthritis of their Left  hip with progressively worsening pain and  dysfunction.The patient has failed nonoperative management and presents for  total hip arthroplasty.   PROCEDURE IN DETAIL: After successful administration of spinal  anesthetic, the traction [facility] were [facility] on both  feet and the [facility], boots placed into the leg  holders. The Left hip was then isolated from the perineum with plastic  drapes and prepped and draped in the usual sterile fashion. ASIS and  greater trochanter were marked and a oblique incision was made, starting  at about 1 cm lateral and 2 cm distal to the ASIS and coursing towards  the anterior cortex of the femur. The skin was cut with a 10 blade  through subcutaneous tissue to the level of the fascia overlying the  tensor fascia lata muscle. The fascia was then incised in line with the  incision at the junction of the anterior third and posterior 2/3rd. The  muscle was teased off the fascia and then the interval between the TFL  and the rectus was developed. The Hohmann retractor was then placed at  the top of the femoral neck over the capsule. The vessels overlying the  capsule were cauterized and the fat on top of the capsule was removed.  A Hohmann retractor was then placed anterior underneath the rectus  femoris to give exposure to the entire anterior capsule. A T-shaped   capsulotomy was performed. The edges were tagged and the femoral head  was identified.       Osteophytes are removed off the superior acetabulum.  The femoral neck was then cut in situ with an oscillating saw. Traction  was then applied to the left lower extremity utilizing the [facility]  traction. The femoral head was then removed. Retractors were placed  around the acetabulum and then circumferential removal of the labrum was  performed. Osteophytes were also removed. Reaming starts at 49 mm to  medialize and  Increased in 2 mm increments to 51 mm. We reamed in  approximately 40 degrees of abduction, 20 degrees anteversion. A 52 mm  pinnacle acetabular shell was then impacted in anatomic position under  fluoroscopic guidance with excellent purchase. We did not need to place  any additional dome screws. A 32 mm neutral + 4 marathon liner was then  placed into the acetabular shell.       The femoral lift was then placed along the lateral aspect of the femur  just distal to the vastus ridge. The leg was  externally rotated and capsule  was stripped off the inferior aspect of the femoral neck down to the  level of the lesser trochanter, this was done with electrocautery. The femur was lifted after this was performed. The  leg was then placed in an extended and adducted position essentially delivering the femur. We also removed the capsule superiorly and the piriformis from the piriformis  fossa to gain excellent exposure of the  proximal femur. Rongeur was used to remove some cancellous bone to get  into the lateral portion of the proximal femur for placement of the  initial starter reamer. The starter broaches was placed  the starter broach  and was shown to go down the center of the canal. Broaching  with the  Actis system was then performed starting at size 0, coursing  Up to size 6. A size 6 had excellent torsional and rotational  and axial stability. The trial high offset neck was then placed   with a 32 + 1 trial head. The hip was then reduced. We confirmed that  the stem was in the canal both on AP and lateral x-rays. It also has excellent sizing. The hip was reduced with outstanding stability through full extension and full external rotation.. AP pelvis was taken and the leg lengths were measured and found to be equal. Hip was then dislocated again and the femoral head and neck removed. The  femoral broach was removed. Size 6 Actis stem with a high offset  neck was then impacted into the femur following native anteversion. Has  excellent purchase in the canal. Excellent torsional and rotational and  axial stability. It is confirmed to be in the canal on AP and lateral  fluoroscopic views. The 32 + 1 metal head was placed and the hip  reduced with outstanding stability. Again AP pelvis was taken and it  confirmed that the leg lengths were equal. The wound was then copiously  irrigated with saline solution and the capsule reattached and repaired  with Ethibond suture. 30 ml of .25% Bupivicaine was  injected into the capsule and into the edge of the tensor fascia lata as well as subcutaneous tissue. The fascia overlying the tensor fascia lata was then closed with a running #1 V-Loc. Subcu was closed with interrupted 2-0 Vicryl and subcuticular running 4-0 Monocryl. Incision was cleaned  and dried. Steri-Strips and a bulky sterile dressing applied. Hemovac  drain was hooked to suction and then the patient was awakened and transported to  recovery in stable condition.        Please note that a surgical assistant was a medical necessity for this procedure to perform it in a safe and expeditious manner. Assistant was necessary to provide appropriate retraction of vital neurovascular structures and to prevent femoral fracture and allow for anatomic placement of the prosthesis.  Ollen Gross, M.D.

## 2017-08-28 NOTE — Interval H&P Note (Signed)
History and Physical Interval Note:  08/28/2017 11:01 AM  Omar Bridges  has presented today for surgery, with the diagnosis of Osteoarthritis Left hip  The various methods of treatment have been discussed with the patient and family. After consideration of risks, benefits and other options for treatment, the patient has consented to  Procedure(s): LEFT TOTAL HIP ARTHROPLASTY ANTERIOR APPROACH (Left) as a surgical intervention .  The patient's history has been reviewed, patient examined, no change in status, stable for surgery.  I have reviewed the patient's chart and labs.  Questions were answered to the patient's satisfaction.     [name]

## 2017-08-28 NOTE — Anesthesia Postprocedure Evaluation (Addendum)
Anesthesia Post Note  Patient: Omar Bridges  Procedure(s) Performed: LEFT TOTAL HIP ARTHROPLASTY ANTERIOR APPROACH (Left Hip)     Patient location during evaluation: PACU Anesthesia Type: Spinal Level of consciousness: oriented and awake and alert Pain management: pain level controlled Vital Signs Assessment: post-procedure vital signs reviewed and stable Respiratory status: spontaneous breathing, respiratory function stable, patient connected to nasal cannula oxygen and nonlabored ventilation Cardiovascular status: blood pressure returned to baseline and stable Postop Assessment: no headache, no backache, no apparent nausea or vomiting, spinal receding and patient able to bend at knees Anesthetic complications: no    Last Vitals:  Vitals:   08/28/17 1445 08/28/17 1500  BP: 113/68 127/73  Pulse: 64 87  Resp: (!) 22 17  Temp:    SpO2: 100% 98%    Last Pain:  Vitals:   08/28/17 1430  TempSrc:   PainSc: 0-No pain                 Odean Mcelwain A.

## 2017-08-28 NOTE — Anesthesia Procedure Notes (Signed)
Spinal  Patient location during procedure: OR Start time: 08/28/2017 12:32 PM Staffing Anesthesiologist: Mal AmabileFoster, Indiah Heyden, MD Performed: anesthesiologist  Preanesthetic Checklist Completed: patient identified, site marked, surgical consent, pre-op evaluation, timeout performed, IV checked, risks and benefits discussed and monitors and equipment checked Spinal Block Patient position: sitting Prep: site prepped and draped and DuraPrep Patient monitoring: heart rate, cardiac monitor, continuous pulse ox and blood pressure Approach: midline Location: L3-4 Injection technique: single-shot Needle Needle type: Pencan  Needle gauge: 24 G Needle length: 9 cm Needle insertion depth: 6 cm Assessment Sensory level: T6 Additional Notes Patient tolerated procedure well. Adequate sensory level.

## 2017-08-28 NOTE — Transfer of Care (Signed)
Immediate Anesthesia Transfer of Care Note  Patient: Omar Bridges  Procedure(s) Performed: Procedure(s): LEFT TOTAL HIP ARTHROPLASTY ANTERIOR APPROACH (Left)  Patient Location: PACU  Anesthesia Type:Spinal  Level of Consciousness:  sedated, patient cooperative and responds to stimulation  Airway & Oxygen Therapy:Patient Spontanous Breathing and Patient connected to face mask oxgen  Post-op Assessment:  Report given to PACU RN and Post -op Vital signs reviewed and stable  Post vital signs:  Reviewed and stable  Last Vitals:  Vitals:   08/28/17 1003  BP: 125/63  Pulse: 78  Resp: 18  Temp: 36.6 C  SpO2: 98%    Complications: No apparent anesthesia complications

## 2017-08-28 NOTE — Anesthesia Preprocedure Evaluation (Addendum)
Anesthesia Evaluation  Patient identified by MRN, date of birth, ID band Patient awake    Reviewed: Allergy & Precautions, NPO status , Patient's Chart, lab work & pertinent test results, reviewed documented beta blocker date and time   Airway Mallampati: II  TM Distance: >3 FB Neck ROM: Full    Dental  (+) Teeth Intact   Pulmonary neg pulmonary ROS,    Pulmonary exam normal breath sounds clear to auscultation       Cardiovascular hypertension, Pt. on home beta blockers and Pt. on medications + angina with exertion + CAD, + CABG and + Peripheral Vascular Disease   Rhythm:Regular Rate:Normal   CABG x5 in March 2012 with LIMA to LAD, SVG to D1, sequential SVG to OM1 and OM 2, sequential SVG to proximal and mid PDA.  Carotid Doppler prior to the surgery showed no significant stenosis.  Echo-EF 50 to 55%, mild MR, mild to moderate TR.  Myoview performed on the following day showed EF 66%, no myocardial perfusion defect was seen during stress or resting image.   Neuro/Psych  Neuromuscular disease negative psych ROS   GI/Hepatic negative GI ROS, Neg liver ROS,   Endo/Other  diabetes, Well Controlled, Type 2, Oral Hypoglycemic AgentsHyperlipidemia  Renal/GU negative Renal ROS   ED negative genitourinary   Musculoskeletal  (+) Arthritis , Osteoarthritis,  Iliotibial band syndrome OA left hip   Abdominal (+) - obese,   Peds  Hematology negative hematology ROS (+)   Anesthesia Other Findings   Reproductive/Obstetrics                           Anesthesia Physical Anesthesia Plan  ASA: III  Anesthesia Plan: Spinal   Post-op Pain Management:    Induction:   PONV Risk Score and Plan: 2 and Ondansetron and Propofol infusion  Airway Management Planned: Natural Airway, Nasal Cannula and Simple Face Mask  Additional Equipment:   Intra-op Plan:   Post-operative Plan:   Informed Consent: I have  reviewed the patients History and Physical, chart, labs and discussed the procedure including the risks, benefits and alternatives for the proposed anesthesia with the patient or authorized representative who has indicated his/her understanding and acceptance.   Dental advisory given  Plan Discussed with: CRNA, Anesthesiologist and Surgeon  Anesthesia Plan Comments:         Anesthesia Quick Evaluation

## 2017-08-29 ENCOUNTER — Encounter (HOSPITAL_COMMUNITY): Payer: Self-pay | Admitting: Orthopedic Surgery

## 2017-08-29 LAB — CBC
HCT: 40 % (ref 39.0–52.0)
HEMOGLOBIN: 13.8 g/dL (ref 13.0–17.0)
MCH: 32.7 pg (ref 26.0–34.0)
MCHC: 34.5 g/dL (ref 30.0–36.0)
MCV: 94.8 fL (ref 78.0–100.0)
Platelets: 201 10*3/uL (ref 150–400)
RBC: 4.22 MIL/uL (ref 4.22–5.81)
RDW: 12.5 % (ref 11.5–15.5)
WBC: 18.4 10*3/uL — ABNORMAL HIGH (ref 4.0–10.5)

## 2017-08-29 LAB — BASIC METABOLIC PANEL
ANION GAP: 11 (ref 5–15)
BUN: 23 mg/dL — AB (ref 6–20)
CO2: 25 mmol/L (ref 22–32)
Calcium: 9.3 mg/dL (ref 8.9–10.3)
Chloride: 104 mmol/L (ref 101–111)
Creatinine, Ser: 1.06 mg/dL (ref 0.61–1.24)
GFR calc Af Amer: >60 mL/min (ref >60)
GFR calc non Af Amer: >60 mL/min (ref >60)
GLUCOSE: 184 mg/dL — AB (ref 65–99)
POTASSIUM: 5.3 mmol/L — AB (ref 3.5–5.1)
SODIUM: 140 mmol/L (ref 135–145)

## 2017-08-29 LAB — GLUCOSE, CAPILLARY: Glucose-Capillary: 187 mg/dL — ABNORMAL HIGH (ref 65–99)

## 2017-08-29 MED ORDER — METHOCARBAMOL 500 MG PO TABS: 500.0000 mg | ORAL_TABLET | Freq: Four times a day (QID) | ORAL | 0 refills | Status: DC | PRN

## 2017-08-29 MED ORDER — TRAMADOL HCL 50 MG PO TABS: 50.0000 mg | ORAL_TABLET | Freq: Four times a day (QID) | ORAL | 0 refills | Status: DC | PRN

## 2017-08-29 MED ORDER — RIVAROXABAN 10 MG PO TABS: 10.0000 mg | ORAL_TABLET | Freq: Every day | ORAL | 0 refills | Status: DC

## 2017-08-29 MED ORDER — OXYCODONE HCL 5 MG PO TABS: 5.0000 mg | ORAL_TABLET | Freq: Four times a day (QID) | ORAL | 0 refills | Status: DC | PRN

## 2017-08-29 MED ORDER — TAMSULOSIN HCL 0.4 MG PO CAPS
0.4000 mg | ORAL_CAPSULE | Freq: Every day | ORAL | Status: DC
  Administered 2017-08-29 – 2017-08-30 (×2): 0.4 mg via ORAL
  Filled 2017-08-29 (×2): qty 1

## 2017-08-29 NOTE — Evaluation (Signed)
Physical Therapy Evaluation Patient Details Name: Omar Bridges MRN: [id number] DOB: [birth year] Today's Date: 08/29/2017   History of Present Illness  L DA-THA  Clinical Impression  Pt is s/p THA resulting in the deficits listed below (see PT Problem List). Pt ambulated 140' with RW, no loss of balance. Instructed pt in HEP. Good progress expected.  Pt will benefit from skilled PT to increase their independence and safety with mobility to allow discharge to the venue listed below.      Follow Up Recommendations Follow surgeon's recommendation for DC plan and follow-up therapies    Equipment Recommendations  None recommended by PT    Recommendations for Other Services       Precautions / Restrictions Precautions Precautions: Fall Restrictions Weight Bearing Restrictions: No Other Position/Activity Restrictions: WBAT      Mobility  Bed Mobility Overal bed mobility: Modified Independent             General bed mobility comments: HOB up 40*  Transfers Overall transfer level: Needs assistance Equipment used: Rolling walker (2 wheeled) Transfers: Sit to/from Stand Sit to Stand: Min assist         General transfer comment: VCs hand placment, min A to rise  Ambulation/Gait Ambulation/Gait assistance: Min guard Ambulation Distance (Feet): 140 Feet Assistive device: Rolling walker (2 wheeled) Gait Pattern/deviations: Step-to pattern;Decreased step length - right;Decreased step length - left Gait velocity: decr   General Gait Details: steady with RW  Stairs            Wheelchair Mobility    Modified Rankin (Stroke Patients Only)       Balance Overall balance assessment: Modified Independent                                           Pertinent Vitals/Pain Pain Assessment: 0-10 Pain Score: 7  Pain Location: L hip Pain Descriptors / Indicators: Sore Pain Intervention(s): Limited activity within patient's tolerance;Monitored  during session;Premedicated before session;Repositioned;Ice applied    Home Living Family/patient expects to be discharged to:: Private residence Living Arrangements: Spouse/significant other Available Help at Discharge: Family;Available 24 hours/day Type of Home: Independent living facility Home Access: Level entry     Home Layout: One level Home Equipment: Walker - 2 wheels;Shower seat;Bedside commode      Prior Function Level of Independence: Independent         Comments: no falls      Hand Dominance        Extremity/Trunk Assessment   Upper Extremity Assessment Upper Extremity Assessment: Overall WFL for tasks assessed    Lower Extremity Assessment Lower Extremity Assessment: LLE deficits/detail LLE Deficits / Details: knee ext -35* AAROM, hip 2/5 LLE Sensation: decreased light touch    Cervical / Trunk Assessment Cervical / Trunk Assessment: Kyphotic  Communication   Communication: No difficulties  Cognition Arousal/Alertness: Awake/alert Behavior During Therapy: WFL for tasks assessed/performed Overall Cognitive Status: Within Functional Limits for tasks assessed                                        General Comments      Exercises Total Joint Exercises Ankle Circles/Pumps: AROM;Both;10 reps;Supine Quad Sets: AROM;Left;5 reps;Supine Heel Slides: AAROM;Left;10 reps;Supine Hip ABduction/ADduction: AAROM;Left;10 reps;Supine Long Arc Quad: AAROM;Left;5 reps;Seated   Assessment/Plan  PT Assessment Patient needs continued PT services  PT Problem List Decreased strength;Decreased range of motion;Decreased activity tolerance;Decreased mobility;Pain       PT Treatment Interventions DME instruction;Gait training;Therapeutic activities;Functional mobility training;Therapeutic exercise;Patient/family education    PT Goals (Current goals can be found in the Care Plan section)  Acute Rehab PT Goals Patient Stated Goal: return to  exercise classes PT Goal Formulation: With patient/family Time For Goal Achievement: 09/05/17 Potential to Achieve Goals: Good    Frequency 7X/week   Barriers to discharge        Co-evaluation               AM-PAC PT "6 Clicks" Daily Activity  Outcome Measure Difficulty turning over in bed (including adjusting bedclothes, sheets and blankets)?: A Little Difficulty moving from lying on back to sitting on the side of the bed? : A Little Difficulty sitting down on and standing up from a chair with arms (e.g., wheelchair, bedside commode, etc,.)?: A Little Help needed moving to and from a bed to chair (including a wheelchair)?: A Little Help needed walking in hospital room?: A Little Help needed climbing 3-5 steps with a railing? : A Lot 6 Click Score: 17    End of Session Equipment Utilized During Treatment: Gait belt Activity Tolerance: Patient tolerated treatment well Patient left: in chair;with call bell/phone within reach;with family/visitor present Nurse Communication: Mobility status PT Visit Diagnosis: Difficulty in walking, not elsewhere classified (R26.2);Pain Pain - Right/Left: Left Pain - part of body: Hip    Time: [id number] PT Time Calculation (min) (ACUTE ONLY): 19 min   Charges:   PT Evaluation $PT Eval Low Complexity: 1 Low     PT G Codes:          Tamala SerUhlenberg, Jadin Creque Kistler 08/29/2017, 11:19 AM [phone]

## 2017-08-29 NOTE — Progress Notes (Signed)
Patient complained of suddenly feeling shaky and "jittery". Vitals stable, blood sugar 187. Patient reports having finished a full sugar caffeinated soda. Patient reported feeling sensations subside shortly after. Will cont to monitor.

## 2017-08-29 NOTE — Progress Notes (Signed)
   Subjective: 1 Day Post-Op Procedure(s) (LRB): LEFT TOTAL HIP ARTHROPLASTY ANTERIOR APPROACH (Left) Patient reports pain as mild.   Patient seen in rounds with Dr. Lequita HaltAluisio. Patient is well, and has had no acute complaints or problems other than discomfort in the left hip. No SOB or chest pain. Voiding and reports flatus.  We will start therapy today.  Plan is to go Home after hospital stay.  Objective: Vital signs in last 24 hours: Temp:  [97.4 F (36.3 C)-97.8 F (36.6 C)] 97.8 F (36.6 C) (06/06 0523) Pulse Rate:  [64-87] 83 (06/06 0523) Resp:  [13-22] 16 (06/06 0523) BP: (97-142)/(55-73) 126/62 (06/06 0523) SpO2:  [94 %-100 %] 96 % (06/06 0523) Weight:  [88.2 kg (194 lb 6.4 oz)] 88.2 kg (194 lb 6.4 oz) (06/05 1021)  Intake/Output from previous day:  Intake/Output Summary (Last 24 hours) at 08/29/2017 0751 Last data filed at 08/29/2017 0600 Gross per 24 hour  Intake 3788.76 ml  Output 2505 ml  Net 1283.76 ml     Labs: Recent Labs    08/29/17 0530  HGB 13.8   Recent Labs    08/29/17 0530  WBC 18.4*  RBC 4.22  HCT 40.0  PLT 201   Recent Labs    08/29/17 0530  NA 140  K 5.3*  CL 104  CO2 25  BUN 23*  CREATININE 1.06  GLUCOSE 184*  CALCIUM 9.3   Recent Labs    08/26/17 1017  INR 0.97    EXAM General - Patient is Alert and Oriented Extremity - Neurologically intact Sensation intact distally Intact pulses distally Dorsiflexion/Plantar flexion intact Compartment soft Dressing - dressing C/D/I Motor Function - intact, moving foot and toes well on exam.  Hemovac pulled without difficulty.  Past Medical History:  Diagnosis Date  . Allergy   . Anginal pain (HCC) 08/20/2017   [facility] and had work uo- records on chart  . Arthritis   . Chicken pox   . Cholelithiasis   . Coronary artery disease   . Diabetes mellitus without complication (HCC)   . ED (erectile dysfunction)   . Heart disease    multi-vessel dz LD, Crc. PDA, OM   . Hyperlipidemia   . Pericarditis 1978    Assessment/Plan: 1 Day Post-Op Procedure(s) (LRB): LEFT TOTAL HIP ARTHROPLASTY ANTERIOR APPROACH (Left) Principal Problem:   OA (osteoarthritis) of hip  Estimated body mass index is 26.37 kg/m as calculated from the following:   Height as of this encounter: 6' (1.829 m).   Weight as of this encounter: 88.2 kg (194 lb 6.4 oz). Advance diet Up with therapy D/C IV fluids when tolerating POs well  DVT Prophylaxis - Xarelto Weight Bearing As Tolerated   Will have him work with therapy today. If he progresses well and is meeting goals, DC home this afternoon. Follow up in office in 2 weeks.   Dimitri PedAmber Roniya Tetro, PA-C Orthopaedic Surgery 08/29/2017, 7:51 AM

## 2017-08-29 NOTE — Progress Notes (Signed)
Physical Therapy Treatment Patient Details Name: Omar Bridges MRN: [id number] DOB: 1941-02-10 Today's Date: 08/29/2017    History of Present Illness L DA-THA    PT Comments    Pt is progressing well with mobility, he ambulated 150' with RW with no loss of balance and performed THA HEP with min assist. From PT standpoint, he is ready to DC home.   Follow Up Recommendations  Follow surgeon's recommendation for DC plan and follow-up therapies     Equipment Recommendations  None recommended by PT    Recommendations for Other Services       Precautions / Restrictions Precautions Precautions: Fall Restrictions Weight Bearing Restrictions: No Other Position/Activity Restrictions: WBAT    Mobility  Bed Mobility Overal bed mobility: Modified Independent             General bed mobility comments: up in recliner  Transfers Overall transfer level: Needs assistance Equipment used: Rolling walker (2 wheeled) Transfers: Sit to/from Stand Sit to Stand: Supervision         General transfer comment: VCs hand placment   Ambulation/Gait Ambulation/Gait assistance: Supervision Ambulation Distance (Feet): 150 Feet Assistive device: Rolling walker (2 wheeled) Gait Pattern/deviations: Step-to pattern;Decreased step length - right;Decreased step length - left Gait velocity: decr   General Gait Details: steady with RW   Stairs             Wheelchair Mobility    Modified Rankin (Stroke Patients Only)       Balance Overall balance assessment: Modified Independent                                          Cognition Arousal/Alertness: Awake/alert Behavior During Therapy: WFL for tasks assessed/performed Overall Cognitive Status: Within Functional Limits for tasks assessed                                        Exercises Total Joint Exercises Ankle Circles/Pumps: AROM;Both;10 reps;Supine Quad Sets: AROM;Left;5  reps;Supine Short Arc Quad: AROM;Left;10 reps;Supine Heel Slides: AAROM;Left;10 reps;Supine Hip ABduction/ADduction: AAROM;Left;10 reps;Supine Long Arc Quad: AAROM;Left;Seated;10 reps    General Comments        Pertinent Vitals/Pain Pain Assessment: 0-10 Pain Score: 7  Pain Location: L hip Pain Descriptors / Indicators: Sore Pain Intervention(s): Limited activity within patient's tolerance;Monitored during session;Premedicated before session;Ice applied    Home Living Family/patient expects to be discharged to:: Private residence Living Arrangements: Spouse/significant other Available Help at Discharge: Family;Available 24 hours/day Type of Home: Independent living facility Home Access: Level entry   Home Layout: One level Home Equipment: Walker - 2 wheels;Shower seat;Bedside commode      Prior Function Level of Independence: Independent      Comments: no falls    PT Goals (current goals can now be found in the care plan section) Acute Rehab PT Goals Patient Stated Goal: return to exercise classes PT Goal Formulation: With patient/family Time For Goal Achievement: 09/05/17 Potential to Achieve Goals: Good Progress towards PT goals: Progressing toward goals    Frequency    7X/week      PT Plan Current plan remains appropriate    Co-evaluation              AM-PAC PT "6 Clicks" Daily Activity  Outcome Measure  Difficulty turning over in  bed (including adjusting bedclothes, sheets and blankets)?: A Little Difficulty moving from lying on back to sitting on the side of the bed? : A Little Difficulty sitting down on and standing up from a chair with arms (e.g., wheelchair, bedside commode, etc,.)?: A Little Help needed moving to and from a bed to chair (including a wheelchair)?: A Little Help needed walking in hospital room?: A Little Help needed climbing 3-5 steps with a railing? : A Lot 6 Click Score: 17    End of Session Equipment Utilized During  Treatment: Gait belt Activity Tolerance: Patient tolerated treatment well Patient left: in chair;with call bell/phone within reach;with family/visitor present Nurse Communication: Mobility status PT Visit Diagnosis: Difficulty in walking, not elsewhere classified (R26.2);Pain Pain - Right/Left: Left Pain - part of body: Hip     Time: 1610-9604 PT Time Calculation (min) (ACUTE ONLY): 23 min  Charges:  $Gait Training: 8-22 mins $Therapeutic Exercise: 8-22 mins                    G Codes:          Omar Bridges 08/29/2017, 2:05 PM [phone]

## 2017-08-29 NOTE — Progress Notes (Signed)
Discharge planning, spoke with patient and spouse at beside. [facility] PT to eval and treat. Contacted [facility] for referral. Has RW and 3-n-1. [phone]

## 2017-08-30 LAB — BASIC METABOLIC PANEL
ANION GAP: 11 (ref 5–15)
BUN: 23 mg/dL — ABNORMAL HIGH (ref 6–20)
CALCIUM: 8.6 mg/dL — AB (ref 8.9–10.3)
CO2: 24 mmol/L (ref 22–32)
CREATININE: 0.9 mg/dL (ref 0.61–1.24)
Chloride: 102 mmol/L (ref 101–111)
Glucose, Bld: 159 mg/dL — ABNORMAL HIGH (ref 65–99)
Potassium: 4.3 mmol/L (ref 3.5–5.1)
SODIUM: 137 mmol/L (ref 135–145)

## 2017-08-30 LAB — CBC
HCT: 34.6 % — ABNORMAL LOW (ref 39.0–52.0)
Hemoglobin: 12 g/dL — ABNORMAL LOW (ref 13.0–17.0)
MCH: 32.5 pg (ref 26.0–34.0)
MCHC: 34.7 g/dL (ref 30.0–36.0)
MCV: 93.8 fL (ref 78.0–100.0)
PLATELETS: 199 10*3/uL (ref 150–400)
RBC: 3.69 MIL/uL — ABNORMAL LOW (ref 4.22–5.81)
RDW: 13 % (ref 11.5–15.5)
WBC: 16.6 10*3/uL — AB (ref 4.0–10.5)

## 2017-08-30 MED ORDER — TAMSULOSIN HCL 0.4 MG PO CAPS: 0.4000 mg | ORAL_CAPSULE | Freq: Every day | ORAL | 0 refills | Status: DC

## 2017-08-30 NOTE — Progress Notes (Signed)
Discussed urinary retention issues and bloody urine with Dr. Lequita HaltAluisio this AM. Pt has been voiding since last in/out cath during the night, but after checking post void residual, pt still had greater than 700 in bladder. Per Dr. Lequita HaltAluisio, do not cath at this time, allow pt to continue to void on his own and see if it improves after PT this morning.

## 2017-08-30 NOTE — Progress Notes (Signed)
   Subjective: 2 Days Post-Op Procedure(s) (LRB): LEFT TOTAL HIP ARTHROPLASTY ANTERIOR APPROACH (Left) Patient reports pain as mild.   Patient seen in rounds with Dr. Lequita HaltAluisio. Patient is well, and has had no acute complaints or problems other than urinary retention. No SOB or chest pain. Working with therapy.  We will resume therapy today.  Plan is to go Home after hospital stay.  Objective: Vital signs in last 24 hours: Temp:  [97.5 F (36.4 C)-98.8 F (37.1 C)] 98.8 F (37.1 C) (06/07 0550) Pulse Rate:  [76-101] 101 (06/07 0550) Resp:  [16] 16 (06/07 0550) BP: (106-153)/(55-69) 136/67 (06/07 0550) SpO2:  [91 %-94 %] 94 % (06/07 0550)  Intake/Output from previous day:  Intake/Output Summary (Last 24 hours) at 08/30/2017 0736 Last data filed at 08/30/2017 0600 Gross per 24 hour  Intake 2345 ml  Output 2127 ml  Net 218 ml     Labs: Recent Labs    08/29/17 0530  HGB 13.8   Recent Labs    08/29/17 0530  WBC 18.4*  RBC 4.22  HCT 40.0  PLT 201   Recent Labs    08/29/17 0530 08/30/17 0521  NA 140 137  K 5.3* 4.3  CL 104 102  CO2 25 24  BUN 23* 23*  CREATININE 1.06 0.90  GLUCOSE 184* 159*  CALCIUM 9.3 8.6*    EXAM General - Patient is Alert and Oriented Extremity - Neurologically intact Intact pulses distally Dorsiflexion/Plantar flexion intact Compartment soft Dressing - dressing C/D/I Motor Function - intact, moving foot and toes well on exam.   Past Medical History:  Diagnosis Date  . Allergy   . Anginal pain (HCC) 08/20/2017   [facility] uo- records on chart  . Arthritis   . Chicken pox   . Cholelithiasis   . Coronary artery disease   . Diabetes mellitus without complication (HCC)   . ED (erectile dysfunction)   . Heart disease    multi-vessel dz LD, Crc. PDA, OM  . Hyperlipidemia   . Pericarditis 1978    Assessment/Plan: 2 Days Post-Op Procedure(s) (LRB): LEFT TOTAL HIP ARTHROPLASTY ANTERIOR APPROACH  (Left) Principal Problem:   OA (osteoarthritis) of hip  Estimated body mass index is 26.37 kg/m as calculated from the following:   Height as of this encounter: 6' (1.829 m).   Weight as of this encounter: 88.2 kg (194 lb 6.4 oz). Advance diet Up with therapy  DVT Prophylaxis - Xarelto Weight Bearing As Tolerated  Will monitor urinary output once he gets up with therapy. Continue Flomax. Plan for DC home this afternoon if he is meeting goals. If urinary output continues to be a problem, DC home with foley catheter and follow up urology.   Dimitri PedAmber Derick Seminara, PA-C Orthopaedic Surgery 08/30/2017, 7:36 AM

## 2017-08-30 NOTE — Progress Notes (Signed)
Reviewed discharge instructions, medications and dressing change. Wife and patient have no questions at this time. Will continue to monitor.

## 2017-08-30 NOTE — Progress Notes (Signed)
Physical Therapy Treatment Patient Details Name: Omar Bridges MRN: [id number] DOB: Oct 19, 1940 Today's Date: 08/30/2017    History of Present Illness L DA-THA    PT Comments    POD # 2 am session General transfer comment: VCs hand placment with spouse "hands on" for instruction on safe handling Performed THR TE's following HEP handout.  Pt has met goals to D/C to home.   Follow Up Recommendations  Follow surgeon's recommendation for DC plan and follow-up therapies     Equipment Recommendations  None recommended by PT    Recommendations for Other Services       Precautions / Restrictions Precautions Precautions: None Restrictions Weight Bearing Restrictions: No Other Position/Activity Restrictions: WBAT    Mobility  Bed Mobility               General bed mobility comments: OOB in recliner  Transfers Overall transfer level: Needs assistance   Transfers: Sit to/from Stand Sit to Stand: Supervision         General transfer comment: VCs hand placment with spouse "hands on" for instruction on safe handling  Ambulation/Gait Ambulation/Gait assistance: Supervision Ambulation Distance (Feet): 125 Feet Assistive device: Rolling walker (2 wheeled) Gait Pattern/deviations: Step-to pattern;Decreased step length - right;Decreased step length - left Gait velocity: decr   General Gait Details: steady with RW   Stairs             Wheelchair Mobility    Modified Rankin (Stroke Patients Only)       Balance                                            Cognition Arousal/Alertness: Awake/alert Behavior During Therapy: WFL for tasks assessed/performed Overall Cognitive Status: Within Functional Limits for tasks assessed                                        Exercises   Total Hip Replacement TE's 10 reps ankle pumps 10 reps knee presses 10 reps heel slides 10 reps SAQ's 10 reps ABD Followed by ICE      General Comments        Pertinent Vitals/Pain Pain Assessment: 0-10 Pain Score: 3  Pain Location: L hip Pain Descriptors / Indicators: Sore Pain Intervention(s): Monitored during session;Repositioned;Premedicated before session;Ice applied    Home Living                      Prior Function            PT Goals (current goals can now be found in the care plan section) Progress towards PT goals: Progressing toward goals    Frequency    7X/week      PT Plan Current plan remains appropriate    Co-evaluation              AM-PAC PT "6 Clicks" Daily Activity  Outcome Measure  Difficulty turning over in bed (including adjusting bedclothes, sheets and blankets)?: A Little Difficulty moving from lying on back to sitting on the side of the bed? : A Little   Help needed moving to and from a bed to chair (including a wheelchair)?: A Little Help needed walking in hospital room?: A Little Help needed climbing 3-5 steps with a railing? : A  Lot 6 Click Score: 14    End of Session Equipment Utilized During Treatment: Gait belt Activity Tolerance: Patient tolerated treatment well Patient left: in chair;with call bell/phone within reach;with family/visitor present Nurse Communication: Mobility status(pt ready for D/C to home) PT Visit Diagnosis: Difficulty in walking, not elsewhere classified (R26.2);Pain Pain - Right/Left: Left Pain - part of body: Hip     Time: 1638-4665 PT Time Calculation (min) (ACUTE ONLY): 31 min  Charges:  $Gait Training: 8-22 mins $Therapeutic Exercise: 8-22 mins                    G Codes:       Rica Koyanagi  PTA [facility]  Acute  Rehab Pager      [phone]

## 2017-09-01 NOTE — Discharge Summary (Signed)
Physician Discharge Summary   Patient ID: Omar Bridges MRN: [id number] DOB/AGE: [birth year] 77 y.o.  Admit date: 08/28/2017 Discharge date: 08/30/2017  Primary Diagnosis: Primary osteoarthritis left hip  Admission Diagnoses:  Past Medical History:  Diagnosis Date  . Allergy   . Anginal pain ([place]) 08/20/2017   went to [facility] and had work uo- records on chart  . Arthritis   . Chicken pox   . Cholelithiasis   . Coronary artery disease   . Diabetes mellitus without complication ([place])   . ED (erectile dysfunction)   . Heart disease    multi-vessel dz LD, Crc. PDA, OM  . Hyperlipidemia   . Pericarditis 1978   Discharge Diagnoses:   Principal Problem:   OA (osteoarthritis) of hip  Estimated body mass index is 26.37 kg/m as calculated from the following:   Height as of this encounter: 6' (1.829 m).   Weight as of this encounter: 88.2 kg (194 lb 6.4 oz).  Procedure(s) (LRB): LEFT TOTAL HIP ARTHROPLASTY ANTERIOR APPROACH (Left)   Consults: None  HPI: Omar Bridges, 77 y.o. male, has a history of pain and functional disability in the left hip(s) due to arthritis and patient has failed non-surgical conservative treatments for greater than 12 weeks to include NSAID's and/or analgesics, corticosteriod injections, viscosupplementation injections, flexibility and strengthening excercises and activity modification.  Onset of symptoms was gradual starting 3 years ago with gradually worsening course since that time.The patient noted no past surgery on the left hip(s).  Patient currently rates pain in the left hip at 7 out of 10 with activity. Patient has night pain, worsening of pain with activity and weight bearing, pain that interfers with activities of daily living and pain with passive range of motion. Patient has evidence of subchondral cysts, periarticular osteophytes and joint space narrowing by imaging studies. This condition presents safety issues increasing the risk of  falls. There is no current active infection.    Laboratory Data: Admission on 08/28/2017, Discharged on 08/30/2017  Component Date Value Ref Range Status  . Glucose-Capillary 08/28/2017 119* 65 - 99 mg/dL Final  . Glucose-Capillary 08/28/2017 142* 65 - 99 mg/dL Final  . Comment 1 08/28/2017 Notify RN   Final  . Comment 2 08/28/2017 Document in Chart   Final  . WBC 08/29/2017 18.4* 4.0 - 10.5 K/uL Final  . RBC 08/29/2017 4.22  4.22 - 5.81 MIL/uL Final  . Hemoglobin 08/29/2017 13.8  13.0 - 17.0 g/dL Final  . HCT 08/29/2017 40.0  39.0 - 52.0 % Final  . MCV 08/29/2017 94.8  78.0 - 100.0 fL Final  . MCH 08/29/2017 32.7  26.0 - 34.0 pg Final  . MCHC 08/29/2017 34.5  30.0 - 36.0 g/dL Final  . RDW 08/29/2017 12.5  11.5 - 15.5 % Final  . Platelets 08/29/2017 201  150 - 400 K/uL Final   Performed at [facility], [place] [address], [place], [place] [address]  . Sodium 08/29/2017 140  135 - 145 mmol/L Final  . Potassium 08/29/2017 5.3* 3.5 - 5.1 mmol/L Final  . Chloride 08/29/2017 104  101 - 111 mmol/L Final  . CO2 08/29/2017 25  22 - 32 mmol/L Final  . Glucose, Bld 08/29/2017 184* 65 - 99 mg/dL Final  . BUN 08/29/2017 23* 6 - 20 mg/dL Final  . Creatinine, Ser 08/29/2017 1.06  0.61 - 1.24 mg/dL Final  . Calcium 08/29/2017 9.3  8.9 - 10.3 mg/dL Final  . GFR calc non Af Amer 08/29/2017 >  60  >60 mL/min Final  . GFR calc Af Amer 08/29/2017 >60  >60 mL/min Final   Comment: (NOTE) The eGFR has been calculated using the CKD EPI equation. This calculation has not been validated in all clinical situations. eGFR's persistently <60 mL/min signify possible Chronic Kidney Disease.   Georgiann Hahn gap 08/29/2017 11  5 - 15 Final   Performed at [facility], [place] [address] [place], [place] [address]  . Glucose-Capillary 08/29/2017 187* 65 - 99 mg/dL Final  . WBC 08/30/2017 16.6* 4.0 - 10.5 K/uL Final   WHITE COUNT CONFIRMED ON SMEAR  . RBC 08/30/2017 3.69* 4.22 - 5.81  MIL/uL Final  . Hemoglobin 08/30/2017 12.0* 13.0 - 17.0 g/dL Final  . HCT 08/30/2017 34.6* 39.0 - 52.0 % Final  . MCV 08/30/2017 93.8  78.0 - 100.0 fL Final  . MCH 08/30/2017 32.5  26.0 - 34.0 pg Final  . MCHC 08/30/2017 34.7  30.0 - 36.0 g/dL Final  . RDW 08/30/2017 13.0  11.5 - 15.5 % Final  . Platelets 08/30/2017 199  150 - 400 K/uL Final   Performed at [facility], [place] [address], [place], [place] [address]  . Sodium 08/30/2017 137  135 - 145 mmol/L Final  . Potassium 08/30/2017 4.3  3.5 - 5.1 mmol/L Final   DELTA CHECK NOTED  . Chloride 08/30/2017 102  101 - 111 mmol/L Final  . CO2 08/30/2017 24  22 - 32 mmol/L Final  . Glucose, Bld 08/30/2017 159* 65 - 99 mg/dL Final  . BUN 08/30/2017 23* 6 - 20 mg/dL Final  . Creatinine, Ser 08/30/2017 0.90  0.61 - 1.24 mg/dL Final  . Calcium 08/30/2017 8.6* 8.9 - 10.3 mg/dL Final  . GFR calc non Af Amer 08/30/2017 >60  >60 mL/min Final  . GFR calc Af Amer 08/30/2017 >60  >60 mL/min Final   Comment: (NOTE) The eGFR has been calculated using the CKD EPI equation. This calculation has not been validated in all clinical situations. eGFR's persistently <60 mL/min signify possible Chronic Kidney Disease.   Georgiann Hahn gap 08/30/2017 11  5 - 15 Final   Performed at [facility], [place] [address], [place], [place] [address]  Hospital Outpatient Visit on 08/26/2017  Component Date Value Ref Range Status  . Glucose-Capillary 08/26/2017 171* 65 - 99 mg/dL Final  . MRSA, PCR 08/26/2017 NEGATIVE  NEGATIVE Final  . Staphylococcus aureus 08/26/2017 NEGATIVE  NEGATIVE Final   Comment: (NOTE) The Xpert SA Assay (FDA approved for NASAL specimens in patients 46 years of age and older), is one component of a comprehensive surveillance program. It is not intended to diagnose infection nor to guide or monitor treatment. Performed at [facility], [place] [address] [place], [place] [address]   . aPTT  08/26/2017 33  24 - 36 seconds Final   Performed at [facility], [place] [address], [place], [place] [address]  . Prothrombin Time 08/26/2017 12.8  11.4 - 15.2 seconds Final  . INR 08/26/2017 0.97   Final   Performed at [facility], [place] [address], [place], [place] [address]  . ABO/RH(D) 08/26/2017 O POS   Final  . Antibody Screen 08/26/2017 NEG   Final  . Sample Expiration 08/26/2017 08/31/2017   Final  . Extend sample reason 08/26/2017    Final                   Value:NO TRANSFUSIONS OR PREGNANCY  IN THE PAST 3 MONTHS Performed at [place] [address], [place], [place] [address]   . Color, Urine 08/26/2017 YELLOW  YELLOW Final  . APPearance 08/26/2017 CLEAR  CLEAR Final  . Specific Gravity, Urine 08/26/2017 1.016  1.005 - 1.030 Final  . pH 08/26/2017 5.0  5.0 - 8.0 Final  . Glucose, UA 08/26/2017 50* NEGATIVE mg/dL Final  . Hgb urine dipstick 08/26/2017 NEGATIVE  NEGATIVE Final  . Bilirubin Urine 08/26/2017 NEGATIVE  NEGATIVE Final  . Ketones, ur 08/26/2017 5* NEGATIVE mg/dL Final  . Protein, ur 08/26/2017 NEGATIVE  NEGATIVE mg/dL Final  . Nitrite 08/26/2017 NEGATIVE  NEGATIVE Final  . Leukocytes, UA 08/26/2017 NEGATIVE  NEGATIVE Final   Performed at [facility], [place] [address], [place], [place] [address]  . ABO/RH(D) 08/26/2017    Final                   Value:O POS Performed at [facility], [place] [address] [place], [place] [address]      X-Rays:Dg Pelvis Portable  Result Date: 08/28/2017 CLINICAL DATA:  Status post left hip replacement. EXAM: PORTABLE PELVIS 1-2 VIEWS COMPARISON:  None. FINDINGS: The femoral and acetabular components appear to be well situated. Surgical drain in other expected postoperative changes are seen in the surrounding soft tissues. No fracture or dislocation is noted. IMPRESSION: Status post left total hip arthroplasty. Electronically Signed   By: Marijo Conception, M.D.   On: 08/28/2017 16:55   Dg C-arm 1-60 Min-no Report  Result Date: 08/28/2017 Fluoroscopy was utilized by the requesting physician.  No radiographic interpretation.    EKG: Orders placed or performed in visit on 05/29/17  . EKG 12-Lead     Hospital Course: Patient was admitted to [facility] and taken to the OR and underwent the above state procedure without complications.  Patient tolerated the procedure well and was later transferred to the recovery room and then to the orthopaedic floor for postoperative care.  They were given PO and IV analgesics for pain control following their surgery.  They were given 24 hours of postoperative antibiotics of  Anti-infectives (From admission, onward)   Start     Dose/Rate Route Frequency Ordered Stop   08/28/17 1830  ceFAZolin (ANCEF) IVPB 2g/100 mL premix     2 g 200 mL/hr over 30 Minutes Intravenous Every 6 hours 08/28/17 1610 08/29/17 0030   08/28/17 1000  ceFAZolin (ANCEF) IVPB 2g/100 mL premix     2 g 200 mL/hr over 30 Minutes Intravenous On call to O.R. 08/28/17 5284 08/28/17 1234     and started on DVT prophylaxis in the form of Xarelto.   PT and OT were ordered for total hip protocol.  The patient was allowed to be WBAT with therapy. Discharge planning was consulted to help with postop disposition and equipment needs.  Patient had a fair night on the evening of surgery.  They started to get up OOB with therapy on day one.  Hemovac drain was pulled without difficulty. Had some difficulty with urinary retention. Started on Flomax. Continued to work with therapy into day two.  Dressing was changed on day two and the incision was clean and dry.  The patient had progressed with therapy and meeting their goals.  Incision was healing well. Urinary output improved. Patient was seen in rounds and was ready to go home.  Diet: Cardiac diet Activity:WBAT Follow-up:in 2 weeks Disposition - Home Discharged Condition:  stable   Discharge Instructions    Call MD / Call 911   Complete by:  As directed    If you experience chest pain or shortness of breath, CALL 911 and be transported to the hospital emergency room.  If you develope a fever above 101 F, pus (white drainage) or increased drainage or redness at the wound, or calf pain, call your surgeon's office.   Constipation Prevention   Complete by:  As directed    Drink plenty of fluids.  Prune juice may be helpful.  You may use a stool softener, such as Colace (over the counter) 100 mg twice a day.  Use MiraLax (over the counter) for constipation as needed.   Diet - low sodium heart healthy   Complete by:  As directed    Diet Carb Modified   Complete by:  As directed    Discharge instructions   Complete by:  As directed    Dr. Gaynelle Arabian Total Joint Specialist [facility] [address], [place], [place] [address] [phone]  ANTERIOR APPROACH TOTAL HIP REPLACEMENT POSTOPERATIVE DIRECTIONS   Hip Rehabilitation, Guidelines Following Surgery  The results of a hip operation are greatly improved after range of motion and muscle strengthening exercises. Follow all safety measures which are given to protect your hip. If any of these exercises cause increased pain or swelling in your joint, decrease the amount until you are comfortable again. Then slowly increase the exercises. Call your caregiver if you have problems or questions.   HOME CARE INSTRUCTIONS  Remove items at home which could result in a fall. This includes throw rugs or furniture in walking pathways.  ICE to the affected hip every three hours for 30 minutes at a time and then as needed for pain and swelling.  Continue to use ice on the hip for pain and swelling from surgery. You may notice swelling that will progress down to the foot and ankle.  This is normal after surgery.  Elevate the leg when you are not up walking on it.   Continue to use the breathing machine which  will help keep your temperature down.  It is common for your temperature to cycle up and down following surgery, especially at night when you are not up moving around and exerting yourself.  The breathing machine keeps your lungs expanded and your temperature down.   DIET You may resume your previous home diet once your are discharged from the hospital.  DRESSING / WOUND CARE / SHOWERING You may change your dressing every day with sterile gauze.  Please use good hand washing techniques before changing the dressing.  Do not use any lotions or creams on the incision until instructed by your surgeon. You may start showering once you are discharged home but do not submerge the incision under water. Just pat the incision dry and apply a dry gauze dressing on daily. Change the surgical dressing daily and reapply a dry dressing each time.  ACTIVITY Walk with your walker as instructed. Use walker as long as suggested by your caregivers. Avoid periods of inactivity such as sitting longer than an hour when not asleep. This helps prevent blood clots.  You may resume a sexual relationship in one month or when given the OK by your doctor.  You may return to work once you are cleared by your doctor.  Do not drive a car for 6 weeks or until released by you surgeon.  Do not drive while taking  narcotics.  WEIGHT BEARING Weight bearing as tolerated with assist device (walker, cane, etc) as directed, use it as long as suggested by your surgeon or therapist, typically at least 4-6 weeks.  POSTOPERATIVE CONSTIPATION PROTOCOL Constipation - defined medically as fewer than three stools per week and severe constipation as less than one stool per week.  One of the most common issues patients have following surgery is constipation.  Even if you have a regular bowel pattern at home, your normal regimen is likely to be disrupted due to multiple reasons following surgery.  Combination of anesthesia, postoperative  narcotics, change in appetite and fluid intake all can affect your bowels.  In order to avoid complications following surgery, here are some recommendations in order to help you during your recovery period.  Colace (docusate) - Pick up an over-the-counter form of Colace or another stool softener and take twice a day as long as you are requiring postoperative pain medications.  Take with a full glass of water daily.  If you experience loose stools or diarrhea, hold the colace until you stool forms back up.  If your symptoms do not get better within 1 week or if they get worse, check with your doctor.  Dulcolax (bisacodyl) - Pick up over-the-counter and take as directed by the product packaging as needed to assist with the movement of your bowels.  Take with a full glass of water.  Use this product as needed if not relieved by Colace only.   MiraLax (polyethylene glycol) - Pick up over-the-counter to have on hand.  MiraLax is a solution that will increase the amount of water in your bowels to assist with bowel movements.  Take as directed and can mix with a glass of water, juice, soda, coffee, or tea.  Take if you go more than two days without a movement. Do not use MiraLax more than once per day. Call your doctor if you are still constipated or irregular after using this medication for 7 days in a row.  If you continue to have problems with postoperative constipation, please contact the office for further assistance and recommendations.  If you experience "the worst abdominal pain ever" or develop nausea or vomiting, please contact the office immediatly for further recommendations for treatment.  ITCHING  If you experience itching with your medications, try taking only a single pain pill, or even half a pain pill at a time.  You can also use Benadryl over the counter for itching or also to help with sleep.   TED HOSE STOCKINGS Wear the elastic stockings on both legs for three weeks following surgery  during the day but you may remove then at night for sleeping.  MEDICATIONS See your medication summary on the "After Visit Summary" that the nursing staff will review with you prior to discharge.  You may have some home medications which will be placed on hold until you complete the course of blood thinner medication.  It is important for you to complete the blood thinner medication as prescribed by your surgeon.  Continue your approved medications as instructed at time of discharge.  PRECAUTIONS If you experience chest pain or shortness of breath - call 911 immediately for transfer to the hospital emergency department.  If you develop a fever greater that 101 F, purulent drainage from wound, increased redness or drainage from wound, foul odor from the wound/dressing, or calf pain - CONTACT YOUR SURGEON.  FOLLOW-UP APPOINTMENTS Make sure you keep all of your appointments after your operation with your surgeon and caregivers. You should call the office at the above phone number and make an appointment for approximately two weeks after the date of your surgery or on the date instructed by your surgeon outlined in the "After Visit Summary".  RANGE OF MOTION AND STRENGTHENING EXERCISES  These exercises are designed to help you keep full movement of your hip joint. Follow your caregiver's or physical therapist's instructions. Perform all exercises about fifteen times, three times per day or as directed. Exercise both hips, even if you have had only one joint replacement. These exercises can be done on a training (exercise) mat, on the floor, on a table or on a bed. Use whatever works the best and is most comfortable for you. Use music or television while you are exercising so that the exercises are a pleasant break in your day. This will make your life better with the exercises acting as a break in routine you can look forward to.  Lying on your back, slowly  slide your foot toward your buttocks, raising your knee up off the floor. Then slowly slide your foot back down until your leg is straight again.  Lying on your back spread your legs as far apart as you can without causing discomfort.  Lying on your side, raise your upper leg and foot straight up from the floor as far as is comfortable. Slowly lower the leg and repeat.  Lying on your back, tighten up the muscle in the front of your thigh (quadriceps muscles). You can do this by keeping your leg straight and trying to raise your heel off the floor. This helps strengthen the largest muscle supporting your knee.  Lying on your back, tighten up the muscles of your buttocks both with the legs straight and with the knee bent at a comfortable angle while keeping your heel on the floor.   IF YOU ARE TRANSFERRED TO A SKILLED REHAB FACILITY If the patient is transferred to a skilled rehab facility following release from the hospital, a list of the current medications will be sent to the facility for the patient to continue.  When discharged from the skilled rehab facility, please have the facility set up the patient's [place] prior to being released. Also, the skilled facility will be responsible for providing the patient with their medications at time of release from the facility to include their pain medication, the muscle relaxants, and their blood thinner medication. If the patient is still at the rehab facility at time of the two week follow up appointment, the skilled rehab facility will also need to assist the patient in arranging follow up appointment in our office and any transportation needs.  MAKE SURE YOU:  Understand these instructions.  Get help right away if you are not doing well or get worse.    Pick up stool softner and laxative for home use following surgery while on pain medications. Do not submerge incision under water. Please use good hand washing techniques while  changing dressing each day. May shower starting three days after surgery. Please use a clean towel to pat the incision dry following showers. Continue to use ice for pain and swelling after surgery. Do not use any lotions or creams on the incision until instructed by your surgeon.  Information on my medicine - XARELTO (Rivaroxaban)  Why was Xarelto prescribed for you? Xarelto was prescribed for you to reduce  the risk of blood clots forming after orthopedic surgery. The medical term for these abnormal blood clots is venous thromboembolism (VTE).  What do you need to know about xarelto ? Take your Xarelto ONCE DAILY at the same time every day. You may take it either with or without food.  If you have difficulty swallowing the tablet whole, you may crush it and mix in applesauce just prior to taking your dose.  Take Xarelto exactly as prescribed by your doctor and DO NOT stop taking Xarelto without talking to the doctor who prescribed the medication.  Stopping without other VTE prevention medication to take the place of Xarelto may increase your risk of developing a clot.  After discharge, you should have regular check-up appointments with your healthcare provider that is prescribing your Xarelto.    What do you do if you miss a dose? If you miss a dose, take it as soon as you remember on the same day then continue your regularly scheduled once daily regimen the next day. Do not take two doses of Xarelto on the same day.   Important Safety Information A possible side effect of Xarelto is bleeding. You should call your healthcare provider right away if you experience any of the following: Bleeding from an injury or your nose that does not stop. Unusual colored urine (red or dark brown) or unusual colored stools (red or black). Unusual bruising for unknown reasons. A serious fall or if you hit your head (even if there is no bleeding).  Some medicines may interact with Xarelto  and might increase your risk of bleeding while on Xarelto. To help avoid this, consult your healthcare provider or pharmacist prior to using any new prescription or non-prescription medications, including herbals, vitamins, non-steroidal anti-inflammatory drugs (NSAIDs) and supplements.  This website has more information on Xarelto: [email or web address]   Increase activity slowly as tolerated   Complete by:  As directed      Allergies as of 08/30/2017      Reactions   Sulfa Antibiotics Nausea And Vomiting      Medication List    STOP taking these medications   aspirin EC 81 MG tablet   fish oil-omega-3 fatty acids 1000 MG capsule   OCUVITE-LUTEIN PO   vitamin B-12 500 MCG tablet Commonly known as:  CYANOCOBALAMIN   Vitamin D3 2000 units Tabs     TAKE these medications   ASPERCREME LIDOCAINE EX Apply 1 application topically 4 (four) times daily as needed (FOR PAIN).   atorvastatin 80 MG tablet Commonly known as:  LIPITOR TAKE 1 TABLET BY MOUTH ONCE DAILY What changed:    how much to take  how to take this  when to take this   diphenhydramine-acetaminophen 25-500 MG Tabs tablet Commonly known as:  TYLENOL PM Take 2 tablets by mouth at bedtime as needed.   fluticasone 50 MCG/ACT nasal spray Commonly known as:  FLONASE Place 1 spray into both nostrils daily. What changed:    when to take this  reasons to take this   GLUCOPHAGE 1000 MG tablet Generic drug:  metFORMIN Take 1,000 mg by mouth 2 (two) times daily with a meal.   lisinopril 5 MG tablet Commonly known as:  PRINIVIL,ZESTRIL TAKE 1 TABLET BY MOUTH ONCE DAILY   methocarbamol 500 MG tablet Commonly known as:  ROBAXIN Take 1 tablet (500 mg total) by mouth every 6 (six) hours as needed for muscle spasms.   metoprolol succinate 25 MG 24 hr tablet Commonly known as:  TOPROL-XL TAKE 1/2 (ONE-HALF) TABLET BY MOUTH ONCE DAILY   nitroGLYCERIN 0.4 MG SL tablet Commonly known as:  NITROSTAT Place 1 tablet  (0.4 mg total) under the tongue every 5 (five) minutes as needed for chest pain. As needed/ as directed for chest pain.   oxyCODONE 5 MG immediate release tablet Commonly known as:  Oxy IR/ROXICODONE Take 1-2 tablets (5-10 mg total) by mouth every 6 (six) hours as needed for moderate pain (pain score 4-6).   rivaroxaban 10 MG Tabs tablet Commonly known as:  XARELTO Take 1 tablet (10 mg total) by mouth daily with breakfast.   tamsulosin 0.4 MG Caps capsule Commonly known as:  FLOMAX Take 1 capsule (0.4 mg total) by mouth daily.   traMADol 50 MG tablet Commonly known as:  ULTRAM Take 1-2 tablets (50-100 mg total) by mouth every 6 (six) hours as needed for moderate pain.      Follow-up Information    Gaynelle Arabian, MD. Schedule an appointment as soon as possible for a visit on 09/10/2017.   Specialty:  Orthopedic Surgery Contact information: [address] [place] [address] [phone]        Care, [facility] Follow up.   Specialty:  [place] Why:  physical therapy Contact information: [place] [address] [phone]           Signed: Ardeen Jourdain, PA-C Orthopaedic Surgery 09/01/2017, 7:23 PM

## 2017-09-10 ENCOUNTER — Encounter: Payer: Self-pay | Admitting: Cardiology

## 2018-05-27 ENCOUNTER — Other Ambulatory Visit: Payer: Self-pay | Admitting: Cardiology

## 2018-05-27 DIAGNOSIS — I25810 Atherosclerosis of coronary artery bypass graft(s) without angina pectoris: Secondary | ICD-10-CM

## 2018-05-27 DIAGNOSIS — E7800 Pure hypercholesterolemia, unspecified: Secondary | ICD-10-CM

## 2018-07-16 NOTE — Progress Notes (Signed)
Virtual Visit via Video Note changed to phone visit at patient request due to technical difficulties.   This visit type was conducted due to national recommendations for restrictions regarding the COVID-19 Pandemic (e.g. social distancing) in an effort to limit this patient's exposure and mitigate transmission in our community.  Due to his co-morbid illnesses, this patient is at least at moderate risk for complications without adequate follow up.  This format is felt to be most appropriate for this patient at this time.  All issues noted in this document were discussed and addressed.  A limited physical exam was performed with this format.  Please refer to the patient's chart for his consent to telehealth for [facility].   Evaluation Performed:  Follow-up visit  Date:  07/24/2018   ID:  Omar Bridges, DOB Aug 18, 1940, MRN [id number]  Pt Location: Home Provider Location: Home  PCP:  Omar Marlin, MD  Cardiologist:  Omar Millers, MD   Chief Complaint:  FU CAD  History of Present Illness:    FUCAD.Patient presented in 3/12 with chest pain concerning for unstable angina. Left heart cath showed severe 3 vessel disease with preserved LV ejection fraction. He had CABG x 5 in 3/12(LIMA to the LAD, saphenous vein graft to the diagonal, sequential saphenous vein graft to the first and second marginal and sequential saphenous vein graft to the proximal and midPDA).Carotid Dopplers performed prior to his surgery showed no stenosis. Echocardiogram May 2019 showed ejection fraction 50 to 55%, mild diastolic dysfunction, mild mitral regurgitation and mild to moderate tricuspid regurgitation.  Nuclear study May 2019 showed ejection fraction 66% and no ischemia.  Since last seen, the patient has dyspnea with more extreme activities but not with routine activities. It is relieved with rest. It is not associated with chest pain. There is no orthopnea, PND or pedal edema. There is no syncope or  palpitations. There is no exertional chest pain.   The patient does not have symptoms concerning for COVID-19 infection (fever, chills, cough, or new shortness of breath).    Past Medical History:  Diagnosis Date  . Allergy   . Anginal pain (HCC) 08/20/2017   went to [facility] and had work uo- records on chart  . Arthritis   . Chicken pox   . Cholelithiasis   . Coronary artery disease   . Diabetes mellitus without complication (HCC)   . ED (erectile dysfunction)   . Heart disease    multi-vessel dz LD, Crc. PDA, OM  . Hyperlipidemia   . Pericarditis 1978   Past Surgical History:  Procedure Laterality Date  . 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 CORONARY ARTERY BYPASS GRAFT  4/12   6 vessel: LIMA- LAD, SVG OM, PDA, Circ  . EYE SURGERY     bilateral cataract surgery with lens implants  . NASAL SEPTUM SURGERY    . ROTATOR CUFF REPAIR Right 01/2015  . TOTAL HIP ARTHROPLASTY Left 08/28/2017   Procedure: LEFT TOTAL HIP ARTHROPLASTY ANTERIOR APPROACH;  Surgeon: Ollen Gross, MD;  Location: [facility];  Service: Orthopedics;  Laterality: Left;     Current Meds  Medication Sig  . ASPERCREME LIDOCAINE EX Apply 1 application topically 4 (four) times daily as needed (FOR PAIN).  . ASPIRIN 81 PO aspirin  . atorvastatin (LIPITOR) 80 MG tablet TAKE 1 TABLET BY MOUTH ONCE DAILY (Patient taking differently: TAKE 1 TABLET BY MOUTH ONCE DAILY WITH SUPPER)  . calcium-vitamin  D (OSCAL WITH D) 500-200 MG-UNIT tablet Take by mouth.  . diphenhydramine-acetaminophen (TYLENOL PM) 25-500 MG TABS tablet Take 2 tablets by mouth at bedtime as needed.  . fluticasone (FLONASE) 50 MCG/ACT nasal spray Place 1 spray into both nostrils daily. (Patient taking differently: Place 1 spray into both nostrils daily as needed for allergies. )  . metFORMIN (GLUCOPHAGE) 1000 MG tablet Take 1,000 mg by mouth 2 (two) times daily with a meal.  . metoprolol succinate (TOPROL-XL)  25 MG 24 hr tablet Take 0.5 tablets (12.5 mg total) by mouth daily.  . Multiple Vitamins-Minerals (EYE VITAMINS PO) Take by mouth.  . nitroGLYCERIN (NITROSTAT) 0.4 MG SL tablet Place 1 tablet (0.4 mg total) under the tongue every 5 (five) minutes as needed for chest pain. As needed/ as directed for chest pain.  . Omega-3 Fatty Acids (FISH OIL OMEGA-3 PO) Fish Oil 1,000 mg (120 mg-180 mg) capsule     Allergies:   Sulfa antibiotics and Sulfamethoxazole-trimethoprim   Social History   Tobacco Use  . Smoking status: Never Smoker  . Smokeless tobacco: Former NeurosurgeonUser    Types: Chew  Substance Use Topics  . Alcohol use: No  . Drug use: No     Family Hx: The patient's family history includes COPD in his father; Congestive Heart Failure (age of onset: 3186) in his father; Heart disease in his father; Other (age of onset: 3817) in his mother. There is no history of Coronary artery disease, Diabetes, or Cancer.  ROS:   Please see the history of present illness.    No F/C, productive cough; back and shoulder pain All other systems reviewed and are negative.   Recent Labs: 08/30/2017: BUN 23; Creatinine, Ser 0.90; Hemoglobin 12.0; Platelets 199; Potassium 4.3; Sodium 137   Recent Lipid Panel Lab Results  Component Value Date/Time   CHOL 127 03/11/2014 12:11 PM   TRIG 161.0 (H) 03/11/2014 12:11 PM   HDL 37.60 (L) 03/11/2014 12:11 PM   CHOLHDL 3 03/11/2014 12:11 PM   LDLCALC 57 03/11/2014 12:11 PM    Wt Readings from Last 3 Encounters:  07/24/18 202 lb (91.6 kg)  08/28/17 194 lb 6.4 oz (88.2 kg)  08/26/17 195 lb 6.4 oz (88.6 kg)     Objective:    Vital Signs:  BP 119/82   Pulse 78   Ht 6' (1.829 m)   Wt 202 lb (91.6 kg)   BMI 27.40 kg/m    VITAL SIGNS:  reviewed  No acute distress Answers questions appropriately Normal affect Remainder of physical examination not performed (telehealth visit; coronavirus pandemic)  ASSESSMENT & PLAN:    1. Coronary artery disease-patient denies  chest pain.  Continue medical therapy including aspirin and statin. 2. Hyperlipidemia-continue statin.  Lipids and liver are monitored by primary care. 3. Diabetes mellitus per primary care.  COVID-19 Education: The importance of social distancing was discussed today.  Time:   Today, I have spent 15 minutes with the patient with telehealth technology discussing the above problems.     Medication Adjustments/Labs and Tests Ordered: Current medicines are reviewed at length with the patient today.  Concerns regarding medicines are outlined above.   Tests Ordered: No orders of the defined types were placed in this encounter.   Medication Changes: No orders of the defined types were placed in this encounter.   Disposition:  Follow up in 6 month(s)  Signed, Omar MillersBrian Sanoe Hazan, MD  07/24/2018 9:22 AM    [facility]

## 2018-07-23 ENCOUNTER — Telehealth: Payer: Self-pay | Admitting: Cardiology

## 2018-07-23 NOTE — Telephone Encounter (Signed)
Smartphone/ my chart/ consent/ pre reg completed °

## 2018-07-24 ENCOUNTER — Telehealth (INDEPENDENT_AMBULATORY_CARE_PROVIDER_SITE_OTHER): Payer: Medicare HMO | Admitting: Cardiology

## 2018-07-24 ENCOUNTER — Encounter: Payer: Self-pay | Admitting: Cardiology

## 2018-07-24 VITALS — BP 119/82 | HR 78 | Ht 72.0 in | Wt 202.0 lb

## 2018-07-24 DIAGNOSIS — I10 Essential (primary) hypertension: Secondary | ICD-10-CM

## 2018-07-24 DIAGNOSIS — I2510 Atherosclerotic heart disease of native coronary artery without angina pectoris: Secondary | ICD-10-CM | POA: Diagnosis not present

## 2018-07-24 DIAGNOSIS — E7800 Pure hypercholesterolemia, unspecified: Secondary | ICD-10-CM

## 2018-07-24 NOTE — Patient Instructions (Signed)

## 2019-07-09 NOTE — Progress Notes (Signed)
HPI: FUCAD.Patient presented in 3/12 with chest pain concerning for unstable angina. Left heart cath showed severe 3 vessel disease with preserved LV ejection fraction. He had CABG x 5 in 3/12(LIMA to the LAD, saphenous vein graft to the diagonal, sequential saphenous vein graft to the first and second marginal and sequential saphenous vein graft to the proximal and midPDA).Carotid Dopplers performed prior to his surgery showed no stenosis. Echocardiogram May 2019 showed ejection fraction 50 to 81%, mild diastolic dysfunction, mild mitral regurgitation and mild to moderate tricuspid regurgitation.  Nuclear study May 2019 showed ejection fraction 66% and no ischemia.  Since last seen,the patient has dyspnea with more extreme activities but not with routine activities. It is relieved with rest. It is not associated with chest pain. There is no orthopnea, PND or pedal edema. There is no syncope or palpitations. There is no exertional chest pain.   Current Outpatient Medications  Medication Sig Dispense Refill  . acetaminophen (TYLENOL) 500 MG tablet Take 1,000 mg by mouth at bedtime.    . ASPIRIN 81 PO aspirin    . atorvastatin (LIPITOR) 80 MG tablet TAKE 1 TABLET BY MOUTH ONCE DAILY (Patient taking differently: TAKE 1 TABLET BY MOUTH ONCE DAILY WITH SUPPER) 90 tablet 3  . calcium-vitamin D (OSCAL WITH D) 500-200 MG-UNIT tablet Take by mouth.    . Cholecalciferol (VITAMIN D3 PO) Take 1,000 mcg by mouth daily.    . fluticasone (FLONASE) 50 MCG/ACT nasal spray Place 1 spray into both nostrils daily. (Patient taking differently: Place 1 spray into both nostrils daily as needed for allergies. ) 16 g 11  . metFORMIN (GLUCOPHAGE) 1000 MG tablet Take 1,000 mg by mouth 2 (two) times daily with a meal.    . metoprolol succinate (TOPROL-XL) 25 MG 24 hr tablet Take 0.5 tablets (12.5 mg total) by mouth daily. 45 tablet 0  . Multiple Vitamins-Minerals (EYE VITAMINS PO) Take by mouth.    . Omega-3  Fatty Acids (FISH OIL OMEGA-3 PO) Fish Oil 1,000 mg (120 mg-180 mg) capsule    . Polyethylene Glycol 3350 (MIRALAX PO) Take by mouth as directed.    . vitamin B-12 (CYANOCOBALAMIN) 500 MCG tablet Take 500 mcg by mouth daily.     No current facility-administered medications for this visit.     Past Medical History:  Diagnosis Date  . Allergy   . Anginal pain ([place]) 08/20/2017   went to [facility] and had work uo- records on chart  . Arthritis   . Chicken pox   . Cholelithiasis   . Coronary artery disease   . Diabetes mellitus without complication ([place])   . ED (erectile dysfunction)   . Heart disease    multi-vessel dz LD, Crc. PDA, OM  . Hyperlipidemia   . Pericarditis 1978    Past Surgical History:  Procedure Laterality Date  . AMPUTATION FINGER / THUMB     Traumatic, left middle finger @ age 34  . CORONARY ARTERY BYPASS GRAFT  4/12   6 vessel: LIMA- LAD, SVG OM, PDA, Circ  . EYE SURGERY     bilateral cataract surgery with lens implants  . NASAL SEPTUM SURGERY    . ROTATOR CUFF REPAIR Right 01/2015  . TOTAL HIP ARTHROPLASTY Left 08/28/2017   Procedure: LEFT TOTAL HIP ARTHROPLASTY ANTERIOR APPROACH;  Surgeon: Gaynelle Arabian, MD;  Location: [facility];  Service: Orthopedics;  Laterality: Left;    Social History   Socioeconomic History  . Marital status: Married  Spouse name: Not on file  . Number of children: 1  . Years of education: 89  . Highest education level: Not on file  Occupational History  . Occupation: embalmer    Comment: semi-retired  Tobacco Use  . Smoking status: Never Smoker  . Smokeless tobacco: Former Neurosurgeon    Types: Chew  Substance and Sexual Activity  . Alcohol use: No  . Drug use: No  . Sexual activity: Never    Partners: Female  Other Topics Concern  . 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 Married '62 -. 1 son - killed in MVA. 1 dtr - '64. 2 grandchildren. 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  Marriage in good health. Tries to have a regular exercise program.         Social Determinants of Health   Financial Resource Strain:   . Difficulty of Paying Living Expenses:   Food Insecurity:   . Worried About Programme researcher, broadcasting/film/video in the Last Year:   . Barista in the Last Year:   Transportation Needs:   . Freight forwarder (Medical):   Marland Kitchen Lack of Transportation (Non-Medical):   Physical Activity:   . Days of Exercise per Week:   . Minutes of Exercise per Session:   Stress:   . Feeling of Stress :   Social Connections:   . Frequency of Communication with Friends and Family:   . Frequency of Social Gatherings with Friends and Family:   . Attends Religious Services:   . Active Member of Clubs or Organizations:   . Attends Banker Meetings:   Marland Kitchen Marital Status:   Intimate Partner Violence:   . Fear of Current or Ex-Partner:   . Emotionally Abused:   Marland Kitchen Physically Abused:   . Sexually Abused:     Family History  Problem Relation Age of Onset  . Other Mother 17       scarlet fever, died during child birth with complications of scarlet fever  . COPD Father   . Heart disease Father   . Congestive Heart Failure Father 7  . Coronary artery disease Neg Hx   . Diabetes Neg Hx   . Cancer Neg Hx     ROS: no fevers or chills, productive cough, hemoptysis, dysphasia, odynophagia, melena, hematochezia, dysuria, hematuria, rash, seizure activity, orthopnea, PND, pedal edema, claudication. Remaining systems are negative.  Physical Exam: Well-developed well-nourished in no acute distress.  Skin is warm and dry.  HEENT is normal.  Neck is supple.  Chest is clear to auscultation with normal expansion.  Cardiovascular exam is regular rate and rhythm.  Abdominal exam nontender or distended. No masses palpated. Extremities show no edema. neuro grossly intact  ECG-sinus rhythm at a rate of 66, left axis deviation.  Personally reviewed  A/P  1 coronary  artery disease-patient denies recurrent chest pain.  Continue aspirin and statin.  2 hyperlipidemia-continue statin.  Lipids and liver monitored by primary care.  3 diabetes mellitus-Per primary care.  Olga Millers, MD

## 2019-07-15 ENCOUNTER — Other Ambulatory Visit: Payer: Self-pay

## 2019-07-15 ENCOUNTER — Encounter: Payer: Self-pay | Admitting: Cardiology

## 2019-07-15 ENCOUNTER — Ambulatory Visit: Payer: Medicare Other | Admitting: Cardiology

## 2019-07-15 DIAGNOSIS — E7800 Pure hypercholesterolemia, unspecified: Secondary | ICD-10-CM

## 2019-07-15 DIAGNOSIS — I25810 Atherosclerosis of coronary artery bypass graft(s) without angina pectoris: Secondary | ICD-10-CM

## 2019-07-15 MED ORDER — ATORVASTATIN CALCIUM 80 MG PO TABS: 80.0000 mg | ORAL_TABLET | Freq: Every day | ORAL | 3 refills | Status: DC

## 2019-07-15 NOTE — Patient Instructions (Signed)
Medication Instructions:  Refill sent to the pharmacy electronically.  *If you need a refill on your cardiac medications before your next appointment, please call your pharmacy*   Lab Work:  If you have labs (blood work) drawn today and your tests are completely normal, you will receive your results only by: Marland Kitchen MyChart Message (if you have MyChart) OR . A paper copy in the mail If you have any lab test that is abnormal or we need to change your treatment, we will call you to review the results.   Follow-Up: At [facility], you and your health needs are our priority.  As part of our continuing mission to provide you with exceptional heart care, we have created designated Provider Care Teams.  These Care Teams include your primary Cardiologist (physician) and Advanced Practice Providers (APPs -  Physician Assistants and Nurse Practitioners) who all work together to provide you with the care you need, when you need it.  We recommend signing up for the patient portal called "MyChart".  Sign up information is provided on this After Visit Summary.  MyChart is used to connect with patients for Virtual Visits (Telemedicine).  Patients are able to view lab/test results, encounter notes, upcoming appointments, etc.  Non-urgent messages can be sent to your provider as well.   To learn more about what you can do with MyChart, go to [email or web address].    Your next appointment:   12 month(s)  The format for your next appointment:   Either In Person or Virtual  Provider:   Olga Millers, MD

## 2019-09-01 ENCOUNTER — Other Ambulatory Visit: Payer: Self-pay

## 2019-09-01 DIAGNOSIS — I25810 Atherosclerosis of coronary artery bypass graft(s) without angina pectoris: Secondary | ICD-10-CM

## 2019-09-01 DIAGNOSIS — E7800 Pure hypercholesterolemia, unspecified: Secondary | ICD-10-CM

## 2019-09-01 MED ORDER — METOPROLOL SUCCINATE ER 25 MG PO TB24: 12.5000 mg | ORAL_TABLET | Freq: Every day | ORAL | 3 refills | Status: DC

## 2020-06-24 ENCOUNTER — Other Ambulatory Visit: Payer: Self-pay | Admitting: Cardiology

## 2020-06-24 DIAGNOSIS — E7800 Pure hypercholesterolemia, unspecified: Secondary | ICD-10-CM

## 2020-06-24 DIAGNOSIS — I25810 Atherosclerosis of coronary artery bypass graft(s) without angina pectoris: Secondary | ICD-10-CM

## 2020-07-12 NOTE — Progress Notes (Signed)
HPI: FUCAD.Patient presented in 3/12 with chest pain concerning for unstable angina. Left heart cath showed severe 3 vessel disease with preserved LV ejection fraction. He had CABG x 5 in 3/12(LIMA to the LAD, saphenous vein graft to the diagonal, sequential saphenous vein graft to the first and second marginal and sequential saphenous vein graft to the proximal and midPDA).Carotid Dopplers performed prior to his surgery showed no stenosis.Echocardiogram May 2019 showed ejection fraction 50 to 55%, mild diastolic dysfunction, mild mitral regurgitation and mild to moderate tricuspid regurgitation. Nuclearstudy May 2019 showed ejection fraction 66% and no ischemia. Since last seen,patient denies dyspnea, chest pain, palpitations or syncope.  Current Outpatient Medications  Medication Sig Dispense Refill  . acetaminophen (TYLENOL) 500 MG tablet Take 1,000 mg by mouth at bedtime.    . ASPIRIN 81 PO aspirin    . atorvastatin (LIPITOR) 80 MG tablet Take 1 tablet (80 mg total) by mouth daily. 90 tablet 3  . calcium-vitamin D (OSCAL WITH D) 500-200 MG-UNIT tablet Take by mouth.    . Cholecalciferol (VITAMIN D3 PO) Take 1,000 mcg by mouth daily.    . fluticasone (FLONASE) 50 MCG/ACT nasal spray Place 1 spray into both nostrils daily. (Patient taking differently: Place 1 spray into both nostrils daily as needed for allergies.) 16 g 11  . metoprolol succinate (TOPROL-XL) 25 MG 24 hr tablet TAKE ONE-HALF TABLET BY  MOUTH DAILY 45 tablet 0  . Multiple Vitamins-Minerals (EYE VITAMINS PO) Take by mouth.    . Omega-3 Fatty Acids (FISH OIL OMEGA-3 PO) Fish Oil 1,000 mg (120 mg-180 mg) capsule    . Polyethylene Glycol 3350 (MIRALAX PO) Take by mouth as directed.    . vitamin B-12 (CYANOCOBALAMIN) 500 MCG tablet Take 500 mcg by mouth daily.    . metFORMIN (GLUCOPHAGE) 1000 MG tablet Take 1,000 mg by mouth 2 (two) times daily with a meal.     No current facility-administered medications for this  visit.     Past Medical History:  Diagnosis Date  . Allergy   . Anginal pain (HCC) 08/20/2017   went to [facility] and had work uo- records on chart  . Arthritis   . Chicken pox   . Cholelithiasis   . Coronary artery disease   . Diabetes mellitus without complication (HCC)   . ED (erectile dysfunction)   . Heart disease    multi-vessel dz LD, Crc. PDA, OM  . Hyperlipidemia   . Pericarditis 1978    Past Surgical History:  Procedure Laterality Date  . AMPUTATION FINGER / THUMB     Traumatic, left middle finger @ age 20  . CORONARY ARTERY BYPASS GRAFT  4/12   6 vessel: LIMA- LAD, SVG OM, PDA, Circ  . EYE SURGERY     bilateral cataract surgery with lens implants  . NASAL SEPTUM SURGERY    . ROTATOR CUFF REPAIR Right 01/2015  . TOTAL HIP ARTHROPLASTY Left 08/28/2017   Procedure: LEFT TOTAL HIP ARTHROPLASTY ANTERIOR APPROACH;  Surgeon: Ollen Gross, MD;  Location: [facility];  Service: Orthopedics;  Laterality: Left;    Social History   Socioeconomic History  . Marital status: Married    Spouse name: Not on file  . Number of children: 1  . Years of education: 35  . Highest education level: Not on file  Occupational History  . Occupation: embalmer    Comment: semi-retired  Tobacco Use  . Smoking status: Never Smoker  . Smokeless tobacco: Former Neurosurgeon  Types: Chew  Vaping Use  . Vaping Use: Never used  Substance and Sexual Activity  . Alcohol use: No  . Drug use: No  . Sexual activity: Never    Partners: Female  Other Topics Concern  . 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 Married '62 -. 1 son - killed in MVA. 1 dtr - '64. 2 grandchildren. 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 Tries to have a regular exercise program.         Social Determinants of Health   Financial Resource Strain: Not on file  Food Insecurity: Not on file  Transportation Needs: Not on file  Physical Activity: Not on file   Stress: Not on file  Social Connections: Not on file  Intimate Partner Violence: Not on file    Family History  Problem Relation Age of Onset  . Other Mother 17       scarlet fever, died during child birth with complications of scarlet fever  . COPD Father   . Heart disease Father   . Congestive Heart Failure Father 60  . Coronary artery disease Neg Hx   . Diabetes Neg Hx   . Cancer Neg Hx     ROS: no fevers or chills, productive cough, hemoptysis, dysphasia, odynophagia, melena, hematochezia, dysuria, hematuria, rash, seizure activity, orthopnea, PND, pedal edema, claudication. Remaining systems are negative.  Physical Exam: Well-developed well-nourished in no acute distress.  Skin is warm and dry.  HEENT is normal.  Neck is supple.  Chest is clear to auscultation with normal expansion.  Cardiovascular exam is regular rate and rhythm.  Abdominal exam nontender or distended. No masses palpated. Extremities show no edema. neuro grossly intact  ECG-sinus rhythm at a rate of 80, left axis deviation.  Personally reviewed  A/P  1 coronary artery disease-patient denies chest pain.  Continue medical therapy with aspirin and statin.  2 hyperlipidemia-continue statin. Check lipids and liver.  3 diabetes mellitus-Per primary care.  Olga Millers, MD

## 2020-07-20 ENCOUNTER — Ambulatory Visit: Payer: Medicare Other | Admitting: Cardiology

## 2020-07-20 ENCOUNTER — Encounter: Payer: Self-pay | Admitting: Cardiology

## 2020-07-20 ENCOUNTER — Other Ambulatory Visit: Payer: Self-pay

## 2020-07-20 VITALS — BP 120/50 | HR 80 | Ht 72.0 in | Wt 199.0 lb

## 2020-07-20 DIAGNOSIS — E7800 Pure hypercholesterolemia, unspecified: Secondary | ICD-10-CM

## 2020-07-20 DIAGNOSIS — I25810 Atherosclerosis of coronary artery bypass graft(s) without angina pectoris: Secondary | ICD-10-CM | POA: Diagnosis not present

## 2020-07-20 NOTE — Patient Instructions (Signed)

## 2020-08-30 ENCOUNTER — Other Ambulatory Visit: Payer: Self-pay

## 2020-08-30 DIAGNOSIS — I25810 Atherosclerosis of coronary artery bypass graft(s) without angina pectoris: Secondary | ICD-10-CM

## 2021-07-06 ENCOUNTER — Other Ambulatory Visit: Payer: Self-pay

## 2021-07-06 ENCOUNTER — Emergency Department (HOSPITAL_COMMUNITY): Payer: Medicare Other

## 2021-07-06 ENCOUNTER — Emergency Department (HOSPITAL_COMMUNITY)
Admission: EM | Admit: 2021-07-06 | Discharge: 2021-07-06 | Discharge status: Against Medical Advice | Payer: Medicare Other | Attending: Emergency Medicine | Admitting: Emergency Medicine

## 2021-07-06 ENCOUNTER — Encounter (HOSPITAL_COMMUNITY): Payer: Self-pay

## 2021-07-06 DIAGNOSIS — Z5321 Procedure and treatment not carried out due to patient leaving prior to being seen by health care provider: Secondary | ICD-10-CM | POA: Diagnosis not present

## 2021-07-06 DIAGNOSIS — R079 Chest pain, unspecified: Secondary | ICD-10-CM | POA: Diagnosis not present

## 2021-07-06 DIAGNOSIS — R031 Nonspecific low blood-pressure reading: Secondary | ICD-10-CM | POA: Diagnosis not present

## 2021-07-06 DIAGNOSIS — R002 Palpitations: Secondary | ICD-10-CM | POA: Diagnosis not present

## 2021-07-06 LAB — BASIC METABOLIC PANEL
Anion gap: 9 (ref 5–15)
BUN: 23 mg/dL (ref 8–23)
CO2: 22 mmol/L (ref 22–32)
Calcium: 9.7 mg/dL (ref 8.9–10.3)
Chloride: 105 mmol/L (ref 98–111)
Creatinine, Ser: 0.87 mg/dL (ref 0.61–1.24)
GFR, Estimated: >60 mL/min (ref >60)
Glucose, Bld: 210 mg/dL — ABNORMAL HIGH (ref 70–99)
Potassium: 4.2 mmol/L (ref 3.5–5.1)
Sodium: 136 mmol/L (ref 135–145)

## 2021-07-06 LAB — CBC
HCT: 42.4 % (ref 39.0–52.0)
Hemoglobin: 14.4 g/dL (ref 13.0–17.0)
MCH: 32.1 pg (ref 26.0–34.0)
MCHC: 34 g/dL (ref 30.0–36.0)
MCV: 94.4 fL (ref 80.0–100.0)
Platelets: 203 10*3/uL (ref 150–400)
RBC: 4.49 MIL/uL (ref 4.22–5.81)
RDW: 13.3 % (ref 11.5–15.5)
WBC: 8.7 10*3/uL (ref 4.0–10.5)
nRBC: 0 % (ref 0.0–0.2)

## 2021-07-06 LAB — TROPONIN I (HIGH SENSITIVITY): Troponin I (High Sensitivity): 4 ng/L (ref <18)

## 2021-07-06 NOTE — ED Provider Triage Note (Signed)
Emergency Medicine Provider Triage Evaluation Note ? ?Omar Bridges , a 81 y.o. male  was evaluated in triage.  Pt complains of left sided chest pain and lower BP than usual starting today. Denies any nausea, SOB, or diaphoresis. Denies any lightheadedness. Reports occasional palpitaitons. BP 99/46, 79/33, 91/38, 108/58. Took one 81mg  and chest pain went away. Doesn't have any chest pain now.  ? ?Review of Systems  ?Positive:  ?Negative:  ? ?Physical Exam  ?BP 133/71 (BP Location: Left Arm)   Pulse 72   Temp 98.8 ?F (37.1 ?C) (Oral)   Resp 14   SpO2 95%  ?Gen:   Awake, no distress   ?Resp:  Normal effort  ?MSK:   Moves extremities without difficulty  ?Other:   ? ?Medical Decision Making  ?Medically screening exam initiated at 6:27 PM.  Appropriate orders placed.  was informed that the remainder of the evaluation will be completed by another provider, this initial triage assessment does not replace that evaluation, and the importance of remaining in the ED until their evaluation is complete. ? ?Will order chest pain work up. BP is 133/71 here.  ?  ?Omar Benton, PA-C ?07/06/21 1831 ? ?

## 2021-07-06 NOTE — ED Notes (Signed)
Patient states he will check his mychart for the results  ?

## 2021-07-06 NOTE — ED Triage Notes (Signed)
Pt c/o left sided chest pain that radiated to left side of jaw/ear at 1400 today. Pt's wife took pt's bp and recorded low readings 99/46, 79/33, 91/38 and 108/58. Pt states he took ASA 81mg  and it helped relieved the pain. Pt denies SOB, dizziness.  ?

## 2021-07-09 ENCOUNTER — Encounter: Payer: Self-pay | Admitting: Cardiology

## 2021-07-27 NOTE — Progress Notes (Signed)
? ? ? ? ?HPI: FU CAD. Patient presented in 3/12 with chest pain concerning for unstable angina.  Left heart cath showed severe 3 vessel disease with preserved LV ejection fraction.  He had CABG x 5 in 3/12 (LIMA to the LAD, saphenous vein graft to the diagonal, sequential saphenous vein graft to the first and second marginal and sequential saphenous vein graft to the proximal and mid PDA). Carotid Dopplers performed prior to his surgery showed no stenosis. Echocardiogram May 2019 showed ejection fraction 50 to 55%, mild diastolic dysfunction, mild mitral regurgitation and mild to moderate tricuspid regurgitation. Nuclear study May 2019 showed ejection fraction 66% and no ischemia.  Patient seen in the emergency room July 06, 2021 with chest pain that was atypical.  Troponin normal.  Since that time he has not had exertional chest pain or dyspnea on exertion.  No syncope. ? ?Current Outpatient Medications  ?Medication Sig Dispense Refill  ? acetaminophen (TYLENOL) 500 MG tablet Take 1,000 mg by mouth at bedtime.    ? ASPIRIN 81 PO aspirin    ? atorvastatin (LIPITOR) 80 MG tablet Take 1 tablet (80 mg total) by mouth daily. 90 tablet 3  ? calcium-vitamin D (OSCAL WITH D) 500-200 MG-UNIT tablet Take by mouth.    ? Cholecalciferol (VITAMIN D3 PO) Take 1,000 mcg by mouth daily.    ? fluticasone (FLONASE) 50 MCG/ACT nasal spray Place 1 spray into both nostrils daily. (Patient taking differently: Place 1 spray into both nostrils daily as needed for allergies.) 16 g 11  ? metFORMIN (GLUCOPHAGE) 1000 MG tablet Take 1,000 mg by mouth 2 (two) times daily with a meal.    ? metoprolol succinate (TOPROL-XL) 25 MG 24 hr tablet TAKE ONE-HALF TABLET BY  MOUTH DAILY 45 tablet 0  ? Multiple Vitamins-Minerals (EYE VITAMINS PO) Take by mouth.    ? Omega-3 Fatty Acids (FISH OIL OMEGA-3 PO) Fish Oil 1,000 mg (120 mg-180 mg) capsule    ? Polyethylene Glycol 3350 (MIRALAX PO) Take by mouth as directed.    ? vitamin B-12 (CYANOCOBALAMIN)  500 MCG tablet Take 500 mcg by mouth daily.    ? ?No current facility-administered medications for this visit.  ? ? ? ?Past Medical History:  ?Diagnosis Date  ? Allergy   ? Anginal pain (HCC) 08/20/2017  ? went to [facility] and had work uo- records on chart  ? Arthritis   ? Chicken pox   ? Cholelithiasis   ? Coronary artery disease   ? Diabetes mellitus without complication (HCC)   ? ED (erectile dysfunction)   ? Heart disease   ? multi-vessel dz LD, Crc. PDA, OM  ? Hyperlipidemia   ? Pericarditis 1978  ? ? ?Past Surgical History:  ?Procedure Laterality Date  ? AMPUTATION FINGER / THUMB    ? Traumatic, left middle finger @ age 50  ? CORONARY ARTERY BYPASS GRAFT  4/12  ? 6 vessel: LIMA- LAD, SVG OM, PDA, Circ  ? EYE SURGERY    ? bilateral cataract surgery with lens implants  ? NASAL SEPTUM SURGERY    ? ROTATOR CUFF REPAIR Right 01/2015  ? TOTAL HIP ARTHROPLASTY Left 08/28/2017  ? Procedure: LEFT TOTAL HIP ARTHROPLASTY ANTERIOR APPROACH;  Surgeon: Ollen Gross, MD;  Location: [facility];  Service: Orthopedics;  Laterality: Left;  ? ? ?Social History  ? ?Socioeconomic History  ? Marital status: Married  ?  Spouse name: Not on file  ? Number of children: 1  ? Years of education: 74  ?  Highest education level: Not on file  ?Occupational History  ? 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 Smoking status: Never  ? Smokeless tobacco: Former  ?  Types: Chew  ?  Quit date: 01/01/2010  ?Vaping Use  ? Vaping Use: Never used  ?Substance and Sexual Activity  ? Alcohol use: No  ? Drug use: No  ? Sexual activity: Never  ?  Partners: Female  ?Other Topics Concern  ? Not on file  ?Social History Narrative  ? 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 Married '62 -. 1 son - killed in MVA. 1 dtr - '64. 2 grandchildren. 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 Tries to have a regular exercise program.  ?   ?   ? ?Social Determinants of Health  ? ?Financial Resource Strain: Not on file  ?Food  Insecurity: Not on file  ?Transportation Needs: Not on file  ?Physical Activity: Not on file  ?Stress: Not on file  ?Social Connections: Not on file  ?Intimate Partner Violence: Not on file  ? ? ?Family History  ?Problem Relation Age of Onset  ? Other Mother 59  ?     scarlet fever, died during child birth with complications of scarlet fever  ? COPD Father   ? Heart disease Father   ? Congestive Heart Failure Father 55  ? Coronary artery disease Neg Hx   ? Diabetes Neg Hx   ? Cancer Neg Hx   ? ? ?ROS: no fevers or chills, productive cough, hemoptysis, dysphasia, odynophagia, melena, hematochezia, dysuria, hematuria, rash, seizure activity, orthopnea, PND, pedal edema, claudication. Remaining systems are negative. ? ?Physical Exam: ?Well-developed well-nourished in no acute distress.  ?Skin is warm and dry.  ?HEENT is normal.  ?Neck is supple.  ?Chest is clear to auscultation with normal expansion.  ?Cardiovascular exam is regular rate and rhythm.  ?Abdominal exam nontender or distended. No masses palpated. ?Extremities show no edema. ?neuro grossly intact ? ?A/P ? ?1 coronary artery disease-patient doing well with no recurrent chest pain.  If he has more symptoms will plan further ischemia evaluation such as functional study.  Continue aspirin and statin. ? ?2 hyperlipidemia-continue statin. ? ?3 diabetes mellitus-managed by primary care. ? ?Olga Millers, MD ? ? ? ?

## 2021-08-09 ENCOUNTER — Ambulatory Visit: Payer: Medicare Other | Admitting: Cardiology

## 2021-08-09 ENCOUNTER — Encounter: Payer: Self-pay | Admitting: Cardiology

## 2021-08-09 VITALS — BP 136/78 | HR 84 | Ht 72.0 in | Wt 191.0 lb

## 2021-08-09 DIAGNOSIS — E7800 Pure hypercholesterolemia, unspecified: Secondary | ICD-10-CM | POA: Diagnosis not present

## 2021-08-09 DIAGNOSIS — I25810 Atherosclerosis of coronary artery bypass graft(s) without angina pectoris: Secondary | ICD-10-CM | POA: Diagnosis not present

## 2021-08-09 NOTE — Patient Instructions (Signed)

## 2022-01-10 NOTE — Progress Notes (Signed)
HPI: FU CAD. Patient presented in 3/12 with chest pain concerning for unstable angina.  Left heart cath showed severe 3 vessel disease with preserved LV ejection fraction.  He had CABG x 5 in 3/12 (LIMA to the LAD, saphenous vein graft to the diagonal, sequential saphenous vein graft to the first and second marginal and sequential saphenous vein graft to the proximal and mid PDA). Carotid Dopplers performed prior to his surgery showed no stenosis. Echocardiogram May 2019 showed ejection fraction 50 to 76%, mild diastolic dysfunction, mild mitral regurgitation and mild to moderate tricuspid regurgitation. Nuclear study May 2019 showed ejection fraction 66% and no ischemia. Since last seen, the patient denies any dyspnea on exertion, orthopnea, PND, pedal edema, palpitations, syncope or chest pain.   Current Outpatient Medications  Medication Sig Dispense Refill   acetaminophen (TYLENOL) 500 MG tablet Take 1,000 mg by mouth at bedtime.     ASPIRIN 81 PO aspirin     atorvastatin (LIPITOR) 80 MG tablet Take 1 tablet (80 mg total) by mouth daily. 90 tablet 3   calcium-vitamin D (OSCAL WITH D) 500-200 MG-UNIT tablet Take by mouth.     Cholecalciferol (VITAMIN D3 PO) Take 1,000 mcg by mouth daily.     fluticasone (FLONASE) 50 MCG/ACT nasal spray Place 1 spray into both nostrils daily. 16 g 11   glipiZIDE (GLUCOTROL XL) 5 MG 24 hr tablet Take 5 mg by mouth daily.     metFORMIN (GLUCOPHAGE) 1000 MG tablet Take 1,000 mg by mouth 2 (two) times daily with a meal.     metoprolol succinate (TOPROL-XL) 25 MG 24 hr tablet TAKE ONE-HALF TABLET BY  MOUTH DAILY 45 tablet 0   Multiple Vitamins-Minerals (EYE VITAMINS PO) Take by mouth.     Omega-3 Fatty Acids (FISH OIL OMEGA-3 PO) Fish Oil 1,000 mg (120 mg-180 mg) capsule     Polyethylene Glycol 3350 (MIRALAX PO) Take by mouth as directed.     vitamin B-12 (CYANOCOBALAMIN) 500 MCG tablet Take 500 mcg by mouth daily.     No current facility-administered  medications for this visit.     Past Medical History:  Diagnosis Date   Allergy    Anginal pain ([place]) 08/20/2017   went to [facility] and had work uo- records on chart   Arthritis    Chicken pox    Cholelithiasis    Coronary artery disease    Diabetes mellitus without complication [facility])    ED (erectile dysfunction)    Heart disease    multi-vessel dz LD, Crc. PDA, OM   Hyperlipidemia    Pericarditis 1978    Past Surgical History:  Procedure Laterality Date   AMPUTATION FINGER / THUMB     Traumatic, left middle finger @ age 89   CORONARY ARTERY BYPASS GRAFT  4/12   6 vessel: LIMA- LAD, SVG OM, PDA, Circ   EYE SURGERY     bilateral cataract surgery with lens implants   NASAL SEPTUM SURGERY     ROTATOR CUFF REPAIR Right 01/2015   TOTAL HIP ARTHROPLASTY Left 08/28/2017   Procedure: LEFT TOTAL HIP ARTHROPLASTY ANTERIOR APPROACH;  Surgeon: Gaynelle Arabian, MD;  Location: [facility];  Service: Orthopedics;  Laterality: Left;    Social History   Socioeconomic History   Marital status: Married    Spouse name: Not on file   Number of children: 1   Years of education: 13   Highest education level: Not on file  Occupational History   Occupation:  embalmer    Comment: semi-retired  Tobacco Use   Smoking status: Never   Smokeless tobacco: Former    Types: Chew    Quit date: 01/01/2010  Vaping Use   Vaping Use: Never used  Substance and Sexual Activity   Alcohol use: No   Drug use: No   Sexual activity: Never    Partners: Female  Other Topics Concern   Not on file  Social History Narrative   HSG, 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 Married '62 -. 1 son - killed in MVA. 1 dtr - '64. 2 grandchildren. 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 Tries to have a regular exercise program.         Social Determinants of Health   Financial Resource Strain: Not on file  Food Insecurity: Not on file  Transportation Needs: Not on file  Physical Activity: Not on  file  Stress: Not on file  Social Connections: Not on file  Intimate Partner Violence: Not on file    Family History  Problem Relation Age of Onset   Other Mother 17       scarlet fever, died during child birth with complications of scarlet fever   COPD Father    Heart disease Father    Congestive Heart Failure Father 88   Coronary artery disease Neg Hx    Diabetes Neg Hx    Cancer Neg Hx     ROS: no fevers or chills, productive cough, hemoptysis, dysphasia, odynophagia, melena, hematochezia, dysuria, hematuria, rash, seizure activity, orthopnea, PND, pedal edema, claudication. Remaining systems are negative.  Physical Exam: Well-developed well-nourished in no acute distress.  Skin is warm and dry.  HEENT is normal.  Neck is supple.  Chest is clear to auscultation with normal expansion.  Cardiovascular exam is regular rate and rhythm.  Abdominal exam nontender or distended. No masses palpated. Extremities show no edema. neuro grossly intact   A/P  1 coronary artery disease-patient denies recurrent chest pain.  Plan to continue medical therapy with aspirin and statin.  2 hyperlipidemia-continue statin.  3 diabetes mellitus-Per primary care.  Olga Millers, MD

## 2022-01-24 ENCOUNTER — Ambulatory Visit: Payer: Medicare Other | Attending: Cardiology | Admitting: Cardiology

## 2022-01-24 ENCOUNTER — Encounter: Payer: Self-pay | Admitting: Cardiology

## 2022-01-24 VITALS — BP 124/64 | HR 82 | Ht 72.0 in | Wt 194.1 lb

## 2022-01-24 DIAGNOSIS — I25810 Atherosclerosis of coronary artery bypass graft(s) without angina pectoris: Secondary | ICD-10-CM

## 2022-01-24 DIAGNOSIS — E7800 Pure hypercholesterolemia, unspecified: Secondary | ICD-10-CM | POA: Diagnosis not present

## 2022-01-24 NOTE — Patient Instructions (Signed)
  Follow-Up: At  [facility], you and your health needs are our priority.  As part of our continuing mission to provide you with exceptional heart care, we have created designated Provider Care Teams.  These Care Teams include your primary Cardiologist (physician) and Advanced Practice Providers (APPs -  Physician Assistants and Nurse Practitioners) who all work together to provide you with the care you need, when you need it.  We recommend signing up for the patient portal called "MyChart".  Sign up information is provided on this After Visit Summary.  MyChart is used to connect with patients for Virtual Visits (Telemedicine).  Patients are able to view lab/test results, encounter notes, upcoming appointments, etc.  Non-urgent messages can be sent to your provider as well.   To learn more about what you can do with MyChart, go to [email or web address].    Your next appointment:   12 month(s)  The format for your next appointment:   In Person  Provider:   Brian Crenshaw, MD   

## 2023-04-25 NOTE — Progress Notes (Signed)
HPI: FU CAD. Patient presented in 3/12 with chest pain concerning for unstable angina.  Left heart cath showed severe 3 vessel disease with preserved LV ejection fraction.  He had CABG x 5 in 3/12 (LIMA to the LAD, saphenous vein graft to the diagonal, sequential saphenous vein graft to the first and second marginal and sequential saphenous vein graft to the proximal and mid PDA). Carotid Dopplers performed prior to his surgery showed no stenosis. Echocardiogram May 2019 showed ejection fraction 50 to 55%, mild diastolic dysfunction, mild mitral regurgitation and mild to moderate tricuspid regurgitation. Nuclear study May 2019 showed ejection fraction 66% and no ischemia. Since last seen, the patient denies any dyspnea on exertion, orthopnea, PND, pedal edema, palpitations, syncope or chest pain.   Current Outpatient Medications  Medication Sig Dispense Refill   acetaminophen (TYLENOL) 500 MG tablet Take 1,000 mg by mouth at bedtime.     ASPIRIN 81 PO aspirin     atorvastatin (LIPITOR) 80 MG tablet Take 1 tablet (80 mg total) by mouth daily. 90 tablet 3   calcium-vitamin D (OSCAL WITH D) 500-200 MG-UNIT tablet Take by mouth.     Cholecalciferol (VITAMIN D3 PO) Take 1,000 mcg by mouth daily.     fluticasone (FLONASE) 50 MCG/ACT nasal spray Place 1 spray into both nostrils daily. 16 g 11   glipiZIDE (GLUCOTROL XL) 5 MG 24 hr tablet Take 5 mg by mouth 2 (two) times daily.     metoprolol succinate (TOPROL-XL) 25 MG 24 hr tablet TAKE ONE-HALF TABLET BY  MOUTH DAILY 45 tablet 0   Multiple Vitamins-Minerals (EYE VITAMINS PO) Take by mouth.     Omega-3 Fatty Acids (FISH OIL OMEGA-3 PO) Fish Oil 1,000 mg (120 mg-180 mg) capsule     Polyethylene Glycol 3350 (MIRALAX PO) Take by mouth as directed.     vitamin B-12 (CYANOCOBALAMIN) 500 MCG tablet Take 500 mcg by mouth daily.     metFORMIN (GLUCOPHAGE) 1000 MG tablet Take 1,000 mg by mouth 2 (two) times daily with a meal.     No current  facility-administered medications for this visit.     Past Medical History:  Diagnosis Date   Allergy    Anginal pain (HCC) 08/20/2017   went to [facility] and had work uo- records on chart   Arthritis    Chicken pox    Cholelithiasis    Coronary artery disease    Diabetes mellitus without complication [facility])    ED (erectile dysfunction)    Heart disease    multi-vessel dz LD, Crc. PDA, OM   Hyperlipidemia    Pericarditis 1978    Past Surgical History:  Procedure Laterality Date   AMPUTATION FINGER / THUMB     Traumatic, left middle finger @ age 87   CORONARY ARTERY BYPASS GRAFT  4/12   6 vessel: LIMA- LAD, SVG OM, PDA, Circ   EYE SURGERY     bilateral cataract surgery with lens implants   NASAL SEPTUM SURGERY     ROTATOR CUFF REPAIR Right 01/2015   TOTAL HIP ARTHROPLASTY Left 08/28/2017   Procedure: LEFT TOTAL HIP ARTHROPLASTY ANTERIOR APPROACH;  Surgeon: Ollen Gross, MD;  Location: [facility];  Service: Orthopedics;  Laterality: Left;    Social History   Socioeconomic History   Marital status: Married    Spouse name: Not on file   Number of children: 1   Years of education: 13   Highest education level: Not on file  Occupational History  Occupation: embalmer    Comment: semi-retired  Tobacco Use   Smoking status: Never   Smokeless tobacco: Former    Types: Chew    Quit date: 01/01/2010  Vaping Use   Vaping status: Never Used  Substance and Sexual Activity   Alcohol use: No   Drug use: No   Sexual activity: Never    Partners: Female  Other Topics Concern   Not on file  Social History Narrative   HSG, 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 Married '62 -. 1 son - killed in MVA. 1 dtr - '64. 2 grandchildren. 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 Tries to have a regular exercise program.         Social Drivers of Health   Financial Resource Strain: Low Risk  (04/11/2023)   Received from [facility]   Overall Financial Resource  Strain (CARDIA)    Difficulty of Paying Living Expenses: Not hard at all  Food Insecurity: No Food Insecurity (04/11/2023)   Received from [facility]   Hunger Vital Sign    Worried About Running Out of Food in the Last Year: Never true    Ran Out of Food in the Last Year: Never true  Transportation Needs: No Transportation Needs (04/11/2023)   Received from [facility] - Transportation    Lack of Transportation (Medical): No    Lack of Transportation (Non-Medical): No  Physical Activity: Insufficiently Active (12/24/2022)   Received from [facility]   Exercise Vital Sign    Days of Exercise per Week: 2 days    Minutes of Exercise per Session: 30 min  Stress: No Stress Concern Present (12/24/2022)   Received from [facility] - Occupational Stress Questionnaire    Feeling of Stress : Not at all  Social Connections: Socially Integrated (12/24/2022)   Received from [facility]   Social Network    How would you rate your social network (family, work, friends)?: Good participation with social networks  Intimate Partner Violence: Not At Risk (12/24/2022)   Received from [facility]   HITS    Over the last 12 months how often did your partner physically hurt you?: Never    Over the last 12 months how often did your partner insult you or talk down to you?: Never    Over the last 12 months how often did your partner threaten you with physical harm?: Never    Over the last 12 months how often did your partner scream or curse at you?: Never    Family History  Problem Relation Age of Onset   Other Mother 45       scarlet fever, died during child birth with complications of scarlet fever   COPD Father    Heart disease Father    Congestive Heart Failure Father 19   Coronary artery disease Neg Hx    Diabetes Neg Hx    Cancer Neg Hx     ROS: no fevers or chills, productive cough, hemoptysis, dysphasia, odynophagia, melena,  hematochezia, dysuria, hematuria, rash, seizure activity, orthopnea, PND, pedal edema, claudication. Remaining systems are negative.  Physical Exam: Well-developed well-nourished in no acute distress.  Skin is warm and dry.  HEENT is normal.  Neck is supple.  Chest is clear to auscultation with normal expansion.  Cardiovascular exam is regular rate and rhythm.  Abdominal exam nontender or distended. No masses palpated. Extremities show no edema.  neuro grossly intact  EKG Interpretation Date/Time:  Wednesday May 08 2023 10:46:44 EST Ventricular Rate:  85 PR Interval:  148 QRS Duration:  84 QT Interval:  364 QTC Calculation: 433 R Axis:   231  Text Interpretation: Normal sinus rhythm Right superior axis deviation Pulmonary disease pattern When compared with ECG of 06-Jul-2021 18:27, QRS axis Shifted left Nonspecific T wave abnormality no longer evident in Lateral leads Confirmed by Olga Millers ([id number]) on 05/08/2023 10:48:22 AM    A/P  1 coronary artery disease-patient doing well with no chest pain.  Continue aspirin and statin.  2 hyperlipidemia-continue statin.  3 diabetes mellitus-managed by primary care.  Olga Millers, MD

## 2023-05-08 ENCOUNTER — Ambulatory Visit: Payer: Medicare Other | Attending: Cardiology | Admitting: Cardiology

## 2023-05-08 ENCOUNTER — Encounter: Payer: Self-pay | Admitting: Cardiology

## 2023-05-08 VITALS — BP 140/62 | HR 85 | Ht 72.0 in | Wt 188.8 lb

## 2023-05-08 DIAGNOSIS — E7800 Pure hypercholesterolemia, unspecified: Secondary | ICD-10-CM | POA: Diagnosis not present

## 2023-05-08 DIAGNOSIS — I25810 Atherosclerosis of coronary artery bypass graft(s) without angina pectoris: Secondary | ICD-10-CM | POA: Diagnosis not present

## 2023-05-08 NOTE — Patient Instructions (Signed)

## 2024-02-24 DEATH — deceased
